# Patient Record
Sex: Male | Born: 1980 | Hispanic: Yes | Marital: Married | Smoking: Never smoker
Health system: Southern US, Community
[De-identification: ages and names within clinical notes are randomized; demographics above are authoritative.]

## PROBLEM LIST (undated history)

## (undated) DIAGNOSIS — E119 Type 2 diabetes mellitus without complications: Secondary | ICD-10-CM

## (undated) HISTORY — DX: Type 2 diabetes mellitus without complications: E11.9

---

## 2018-07-08 ENCOUNTER — Other Ambulatory Visit: Payer: Self-pay

## 2018-07-08 ENCOUNTER — Telehealth: Payer: Self-pay

## 2018-07-08 DIAGNOSIS — Z20822 Contact with and (suspected) exposure to covid-19: Secondary | ICD-10-CM

## 2018-07-08 NOTE — Telephone Encounter (Signed)
Matthew Stevenson with [facility] Dept. Requesting COVID 19 results for symptoms. Scheduled for today.

## 2018-07-12 LAB — NOVEL CORONAVIRUS, NAA: SARS-CoV-2, NAA: NOT DETECTED

## 2020-03-20 ENCOUNTER — Inpatient Hospital Stay: Payer: Self-pay

## 2020-03-20 ENCOUNTER — Emergency Department: Payer: Self-pay

## 2020-03-20 ENCOUNTER — Inpatient Hospital Stay
Admission: EM | Admit: 2020-03-20 | Discharge: 2020-03-24 | DRG: 418 | Disposition: A | Discharge status: Home / Self Care | Payer: Self-pay | Attending: Internal Medicine | Admitting: Internal Medicine

## 2020-03-20 ENCOUNTER — Other Ambulatory Visit: Payer: Self-pay

## 2020-03-20 DIAGNOSIS — E1165 Type 2 diabetes mellitus with hyperglycemia: Secondary | ICD-10-CM | POA: Diagnosis present

## 2020-03-20 DIAGNOSIS — K810 Acute cholecystitis: Secondary | ICD-10-CM | POA: Diagnosis present

## 2020-03-20 DIAGNOSIS — K760 Fatty (change of) liver, not elsewhere classified: Secondary | ICD-10-CM | POA: Diagnosis present

## 2020-03-20 DIAGNOSIS — K8000 Calculus of gallbladder with acute cholecystitis without obstruction: Secondary | ICD-10-CM | POA: Diagnosis present

## 2020-03-20 DIAGNOSIS — Z23 Encounter for immunization: Secondary | ICD-10-CM

## 2020-03-20 DIAGNOSIS — K8021 Calculus of gallbladder without cholecystitis with obstruction: Secondary | ICD-10-CM

## 2020-03-20 DIAGNOSIS — R739 Hyperglycemia, unspecified: Secondary | ICD-10-CM | POA: Diagnosis present

## 2020-03-20 DIAGNOSIS — E669 Obesity, unspecified: Secondary | ICD-10-CM | POA: Diagnosis present

## 2020-03-20 DIAGNOSIS — E871 Hypo-osmolality and hyponatremia: Secondary | ICD-10-CM | POA: Diagnosis present

## 2020-03-20 DIAGNOSIS — Z6831 Body mass index (BMI) 31.0-31.9, adult: Secondary | ICD-10-CM

## 2020-03-20 DIAGNOSIS — R7401 Elevation of levels of liver transaminase levels: Secondary | ICD-10-CM | POA: Diagnosis present

## 2020-03-20 DIAGNOSIS — K851 Biliary acute pancreatitis without necrosis or infection: Secondary | ICD-10-CM

## 2020-03-20 DIAGNOSIS — E781 Pure hyperglyceridemia: Secondary | ICD-10-CM | POA: Diagnosis present

## 2020-03-20 DIAGNOSIS — E119 Type 2 diabetes mellitus without complications: Secondary | ICD-10-CM | POA: Diagnosis present

## 2020-03-20 DIAGNOSIS — K828 Other specified diseases of gallbladder: Secondary | ICD-10-CM | POA: Diagnosis present

## 2020-03-20 DIAGNOSIS — R109 Unspecified abdominal pain: Secondary | ICD-10-CM

## 2020-03-20 DIAGNOSIS — Z20822 Contact with and (suspected) exposure to covid-19: Secondary | ICD-10-CM | POA: Diagnosis present

## 2020-03-20 DIAGNOSIS — R1011 Right upper quadrant pain: Secondary | ICD-10-CM

## 2020-03-20 DIAGNOSIS — E118 Type 2 diabetes mellitus with unspecified complications: Secondary | ICD-10-CM

## 2020-03-20 HISTORY — DX: Biliary acute pancreatitis without necrosis or infection: K85.10

## 2020-03-20 LAB — HIV ANTIBODY (ROUTINE TESTING W REFLEX): HIV Screen 4th Generation wRfx: NONREACTIVE

## 2020-03-20 LAB — COMPREHENSIVE METABOLIC PANEL
ALT: 248 U/L — ABNORMAL HIGH (ref 0–44)
AST: 104 U/L — ABNORMAL HIGH (ref 15–41)
Albumin: 4.3 g/dL (ref 3.5–5.0)
Alkaline Phosphatase: 231 U/L — ABNORMAL HIGH (ref 38–126)
Anion gap: 11 (ref 5–15)
BUN: 18 mg/dL (ref 6–20)
CO2: 21 mmol/L — ABNORMAL LOW (ref 22–32)
Calcium: 9.2 mg/dL (ref 8.9–10.3)
Chloride: 99 mmol/L (ref 98–111)
Creatinine, Ser: 0.79 mg/dL (ref 0.61–1.24)
GFR, Estimated: >60 mL/min (ref >60)
Glucose, Bld: 278 mg/dL — ABNORMAL HIGH (ref 70–99)
Potassium: 3.7 mmol/L (ref 3.5–5.1)
Sodium: 131 mmol/L — ABNORMAL LOW (ref 135–145)
Total Bilirubin: 2.2 mg/dL — ABNORMAL HIGH (ref 0.3–1.2)
Total Protein: 9.1 g/dL — ABNORMAL HIGH (ref 6.5–8.1)

## 2020-03-20 LAB — CBC
HCT: 43.8 % (ref 39.0–52.0)
Hemoglobin: 15.3 g/dL (ref 13.0–17.0)
MCH: 30.4 pg (ref 26.0–34.0)
MCHC: 34.9 g/dL (ref 30.0–36.0)
MCV: 86.9 fL (ref 80.0–100.0)
Platelets: 244 10*3/uL (ref 150–400)
RBC: 5.04 MIL/uL (ref 4.22–5.81)
RDW: 13.7 % (ref 11.5–15.5)
WBC: 8.7 10*3/uL (ref 4.0–10.5)
nRBC: 0 % (ref 0.0–0.2)

## 2020-03-20 LAB — URINALYSIS, COMPLETE (UACMP) WITH MICROSCOPIC
Bacteria, UA: NONE SEEN
Bilirubin Urine: NEGATIVE
Glucose, UA: >=500 mg/dL — AB
Hgb urine dipstick: NEGATIVE
Ketones, ur: 20 mg/dL — AB
Leukocytes,Ua: NEGATIVE
Nitrite: NEGATIVE
Protein, ur: NEGATIVE mg/dL
Specific Gravity, Urine: >1.046 — ABNORMAL HIGH (ref 1.005–1.030)
pH: 6 (ref 5.0–8.0)

## 2020-03-20 LAB — CBG MONITORING, ED: Glucose-Capillary: 170 mg/dL — ABNORMAL HIGH (ref 70–99)

## 2020-03-20 LAB — RESP PANEL BY RT-PCR (FLU A&B, COVID) ARPGX2
Influenza A by PCR: NEGATIVE
Influenza B by PCR: NEGATIVE
SARS Coronavirus 2 by RT PCR: NEGATIVE

## 2020-03-20 LAB — LIPASE, BLOOD: Lipase: 246 U/L — ABNORMAL HIGH (ref 11–51)

## 2020-03-20 LAB — TRIGLYCERIDES: Triglycerides: 299 mg/dL — ABNORMAL HIGH (ref <150)

## 2020-03-20 LAB — GLUCOSE, CAPILLARY: Glucose-Capillary: 164 mg/dL — ABNORMAL HIGH (ref 70–99)

## 2020-03-20 LAB — HEMOGLOBIN A1C
Hgb A1c MFr Bld: 8.5 % — ABNORMAL HIGH (ref 4.8–5.6)
Mean Plasma Glucose: 197.25 mg/dL

## 2020-03-20 MED ORDER — MORPHINE SULFATE (PF) 4 MG/ML IV SOLN
4.0000 mg | INTRAVENOUS | Status: DC | PRN
Start: 2020-03-20 — End: 2020-03-20
  Administered 2020-03-20: 4 mg via INTRAVENOUS
  Filled 2020-03-20: qty 1

## 2020-03-20 MED ORDER — PIPERACILLIN-TAZOBACTAM 3.375 G IVPB
3.3750 g | Freq: Three times a day (TID) | INTRAVENOUS | Status: DC
  Administered 2020-03-20 – 2020-03-24 (×13): 3.375 g via INTRAVENOUS
  Filled 2020-03-20 (×15): qty 50

## 2020-03-20 MED ORDER — ONDANSETRON HCL 4 MG/2ML IJ SOLN: 4.0000 mg | Freq: Four times a day (QID) | INTRAMUSCULAR | Status: DC | PRN

## 2020-03-20 MED ORDER — SODIUM CHLORIDE 0.9 % IV SOLN: Freq: Once | INTRAVENOUS | Status: AC

## 2020-03-20 MED ORDER — ENOXAPARIN SODIUM 40 MG/0.4ML ~~LOC~~ SOLN
40.0000 mg | SUBCUTANEOUS | Status: DC
  Administered 2020-03-20 – 2020-03-23 (×4): 40 mg via SUBCUTANEOUS
  Filled 2020-03-20 (×4): qty 0.4

## 2020-03-20 MED ORDER — ONDANSETRON HCL 4 MG PO TABS
4.0000 mg | ORAL_TABLET | Freq: Four times a day (QID) | ORAL | Status: DC | PRN
  Administered 2020-03-21: 18:00:00 4 mg via ORAL
  Filled 2020-03-20: qty 1

## 2020-03-20 MED ORDER — GADOBUTROL 1 MMOL/ML IV SOLN
8.0000 mL | Freq: Once | INTRAVENOUS | Status: AC | PRN
  Administered 2020-03-20: 8 mL via INTRAVENOUS

## 2020-03-20 MED ORDER — INFLUENZA VAC SPLIT QUAD 0.5 ML IM SUSY
0.5000 mL | PREFILLED_SYRINGE | INTRAMUSCULAR | Status: AC
  Administered 2020-03-21: 0.5 mL via INTRAMUSCULAR
  Filled 2020-03-20: qty 0.5

## 2020-03-20 MED ORDER — SODIUM CHLORIDE 0.9 % IV SOLN: INTRAVENOUS | Status: DC

## 2020-03-20 MED ORDER — MORPHINE SULFATE (PF) 2 MG/ML IV SOLN: 2.0000 mg | INTRAVENOUS | Status: DC | PRN

## 2020-03-20 MED ORDER — IOHEXOL 300 MG/ML  SOLN
100.0000 mL | Freq: Once | INTRAMUSCULAR | Status: AC | PRN
  Administered 2020-03-20: 100 mL via INTRAVENOUS
  Filled 2020-03-20: qty 100

## 2020-03-20 MED ORDER — SODIUM CHLORIDE 0.9 % IV BOLUS
1000.0000 mL | Freq: Once | INTRAVENOUS | Status: AC
  Administered 2020-03-20: 1000 mL via INTRAVENOUS

## 2020-03-20 MED ORDER — PANTOPRAZOLE SODIUM 40 MG IV SOLR
40.0000 mg | Freq: Every day | INTRAVENOUS | Status: DC
  Administered 2020-03-20 – 2020-03-24 (×5): 40 mg via INTRAVENOUS
  Filled 2020-03-20 (×5): qty 40

## 2020-03-20 MED ORDER — ONDANSETRON HCL 4 MG/2ML IJ SOLN
4.0000 mg | Freq: Once | INTRAMUSCULAR | Status: AC
  Administered 2020-03-20: 4 mg via INTRAVENOUS
  Filled 2020-03-20: qty 2

## 2020-03-20 MED ORDER — HYDROMORPHONE HCL 1 MG/ML IJ SOLN
0.5000 mg | INTRAMUSCULAR | Status: DC | PRN
  Administered 2020-03-20 – 2020-03-22 (×5): 0.5 mg via INTRAVENOUS
  Filled 2020-03-20 (×5): qty 1

## 2020-03-20 NOTE — ED Provider Notes (Signed)
[facility] Emergency Department Provider Note    Event Date/Time   First MD Initiated Contact with Patient 03/20/20 1059     (approximate)  I have reviewed the triage vital signs and the nursing notes.   HISTORY  Chief Complaint Abdominal Pain    HPI Matthew Stevenson is a 40 y.o. male no significant past medical history presenting with several days of midepigastric pain radiating through to his back associated nausea and couple episodes of vomiting.  Does not drink alcohol.  No previous history of any medical conditions but is not seen a doctor in many years.  Denies any fevers.  No chest pain or shortness of breath.    History reviewed. No pertinent past medical history. Family History  Family history unknown: Yes   History reviewed. No pertinent surgical history. Patient Active Problem List   Diagnosis Date Noted  . Acute gallstone pancreatitis 03/20/2020  . Hyperglycemia 03/20/2020  . Acute cholecystitis 03/20/2020  . Transaminitis 03/20/2020  . Hyponatremia 03/20/2020      Prior to Admission medications   Not on File    Allergies Patient has no allergy information on record.    Social History Social History   Tobacco Use  . Smoking status: Never Smoker  . Smokeless tobacco: Never Used  Substance Use Topics  . Alcohol use: Yes    Comment: soc  . Drug use: Never    Review of Systems Patient denies headaches, rhinorrhea, blurry vision, numbness, shortness of breath, chest pain, edema, cough, abdominal pain, nausea, vomiting, diarrhea, dysuria, fevers, rashes or hallucinations unless otherwise stated above in HPI. ____________________________________________   PHYSICAL EXAM:  VITAL SIGNS: Vitals:   03/20/20 1017 03/20/20 1331  BP: (!) 137/98 135/83  Pulse: 73 60  Resp: 16 18  Temp: 98.4 F (36.9 C)   SpO2: 99% 100%    Constitutional: Alert and oriented.  Eyes: Conjunctivae are normal.  Head: Atraumatic. Nose:  No congestion/rhinnorhea. Mouth/Throat: Mucous membranes are moist.   Neck: No stridor. Painless ROM.  Cardiovascular: Normal rate, regular rhythm. Grossly normal heart sounds.  Good peripheral circulation. Respiratory: Normal respiratory effort.  No retractions. Lungs CTAB. Gastrointestinal: Soft with mild ttp in epigastric region  No distention. No abdominal bruits. No CVA tenderness. Genitourinary:  Musculoskeletal: No lower extremity tenderness nor edema.  No joint effusions. Neurologic:  Normal speech and language. No gross focal neurologic deficits are appreciated. No facial droop Skin:  Skin is warm, dry and intact. No rash noted. Psychiatric: Mood and affect are normal. Speech and behavior are normal.  ____________________________________________   LABS (all labs ordered are listed, but only abnormal results are displayed)  Results for orders placed or performed during the hospital encounter of 03/20/20 (from the past 24 hour(s))  Lipase, blood     Status: Abnormal   Collection Time: 03/20/20 10:19 AM  Result Value Ref Range   Lipase 246 (H) 11 - 51 U/L  Comprehensive metabolic panel     Status: Abnormal   Collection Time: 03/20/20 10:19 AM  Result Value Ref Range   Sodium 131 (L) 135 - 145 mmol/L   Potassium 3.7 3.5 - 5.1 mmol/L   Chloride 99 98 - 111 mmol/L   CO2 21 (L) 22 - 32 mmol/L   Glucose, Bld 278 (H) 70 - 99 mg/dL   BUN 18 6 - 20 mg/dL   Creatinine, Ser 1.61 0.61 - 1.24 mg/dL   Calcium 9.2 8.9 - 09.6 mg/dL   Total Protein 9.1 (  H) 6.5 - 8.1 g/dL   Albumin 4.3 3.5 - 5.0 g/dL   AST 270 (H) 15 - 41 U/L   ALT 248 (H) 0 - 44 U/L   Alkaline Phosphatase 231 (H) 38 - 126 U/L   Total Bilirubin 2.2 (H) 0.3 - 1.2 mg/dL   GFR, Estimated >35 >00 mL/min   Anion gap 11 5 - 15  CBC     Status: None   Collection Time: 03/20/20 10:19 AM  Result Value Ref Range   WBC 8.7 4.0 - 10.5 K/uL   RBC 5.04 4.22 - 5.81 MIL/uL   Hemoglobin 15.3 13.0 - 17.0 g/dL   HCT 93.8 18.2 - 99.3  %   MCV 86.9 80.0 - 100.0 fL   MCH 30.4 26.0 - 34.0 pg   MCHC 34.9 30.0 - 36.0 g/dL   RDW 71.6 96.7 - 89.3 %   Platelets 244 150 - 400 K/uL   nRBC 0.0 0.0 - 0.2 %  Triglycerides     Status: Abnormal   Collection Time: 03/20/20 10:19 AM  Result Value Ref Range   Triglycerides 299 (H) <150 mg/dL  Resp Panel by RT-PCR (Flu A&B, Covid) Nasopharyngeal Swab     Status: None   Collection Time: 03/20/20  1:26 PM   Specimen: Nasopharyngeal Swab; Nasopharyngeal(NP) swabs in vial transport medium  Result Value Ref Range   SARS Coronavirus 2 by RT PCR NEGATIVE NEGATIVE   Influenza A by PCR NEGATIVE NEGATIVE   Influenza B by PCR NEGATIVE NEGATIVE  Urinalysis, Complete w Microscopic Urine, Clean Catch     Status: Abnormal   Collection Time: 03/20/20  1:52 PM  Result Value Ref Range   Color, Urine YELLOW (A) YELLOW   APPearance CLEAR (A) CLEAR   Specific Gravity, Urine >1.046 (H) 1.005 - 1.030   pH 6.0 5.0 - 8.0   Glucose, UA >=500 (A) NEGATIVE mg/dL   Hgb urine dipstick NEGATIVE NEGATIVE   Bilirubin Urine NEGATIVE NEGATIVE   Ketones, ur 20 (A) NEGATIVE mg/dL   Protein, ur NEGATIVE NEGATIVE mg/dL   Nitrite NEGATIVE NEGATIVE   Leukocytes,Ua NEGATIVE NEGATIVE   RBC / HPF 0-5 0 - 5 RBC/hpf   WBC, UA 0-5 0 - 5 WBC/hpf   Bacteria, UA NONE SEEN NONE SEEN   Squamous Epithelial / LPF 0-5 0 - 5   ____________________________________________  EKG My review and personal interpretation at Time: 10:12   Indication: epigastric pain  Rate: 70  Rhythm: sinus Axis: normal Other: normal intervals, no stemi ____________________________________________  RADIOLOGY  I personally reviewed all radiographic images ordered to evaluate for the above acute complaints and reviewed radiology reports and findings.  These findings were personally discussed with the patient.  Please see medical record for radiology report.  ____________________________________________   PROCEDURES  Procedure(s) performed:   Procedures    Critical Care performed: no ____________________________________________   INITIAL IMPRESSION / ASSESSMENT AND PLAN / ED COURSE  Pertinent labs & imaging results that were available during my care of the patient were reviewed by me and considered in my medical decision making (see chart for details).   DDX: Pancreatitis, cholelithiasis, cholecystitis, enteritis, dehydration, electrolyte abnormality  Matthew Stevenson is a 40 y.o. who presents to the ED with presentation as described above.  Blood work ordered at triage shows findings concerning for pancreatitis probable gallstone pancreatitis given mildly elevated LFTs mildly elevated bilirubin.  CT imaging ordered for the above differential does not show any clear etiology.  Right upper quadrant ultrasound  was ordered which shows evidence of cholelithiasis.  Call common bile duct within normal range.  Trace pericholecystic fluid.  Given his pain and symptoms will consult hospitalist.  I did discuss case in consultation with surgery as well.  Discussed case with Dr. Norma Fredrickson GI recommends MRCP to evaluate for to choledocholithiasis though given normal common bile duct as well as reassuring CT likely stone is already passed.  Results and findings discussed with patient.  Clinical Course as of 03/20/20 1545  Wed Mar 20, 2020  1401 Patient's triglycerides are elevated.  On my review of the ultrasound it does show that he is got gallstones.  Do believe he is going require hospitalization for further evaluation. [PR]    Clinical Course User Index [PR] Willy Eddy, MD    The patient was evaluated in Emergency Department today for the symptoms described in the history of present illness. He/she was evaluated in the context of the global COVID-19 pandemic, which necessitated consideration that the patient might be at risk for infection with the SARS-CoV-2 virus that causes COVID-19. Institutional protocols and algorithms that  pertain to the evaluation of patients at risk for COVID-19 are in a state of rapid change based on information released by regulatory bodies including the CDC and federal and state organizations. These policies and algorithms were followed during the patient's care in the ED.  As part of my medical decision making, I reviewed the following data within the electronic MEDICAL RECORD NUMBER Nursing notes reviewed and incorporated, Labs reviewed, notes from prior ED visits and [place] Controlled Substance Database   ____________________________________________   FINAL CLINICAL IMPRESSION(S) / ED DIAGNOSES  Final diagnoses:  RUQ pain  Acute gallstone pancreatitis      NEW MEDICATIONS STARTED DURING THIS VISIT:  New Prescriptions   No medications on file     Note:  This document was prepared using Dragon voice recognition software and may include unintentional dictation errors.    Willy Eddy, MD 03/20/20 [phone]

## 2020-03-20 NOTE — Consult Note (Signed)
[facility] SURGICAL CONSULTATION NOTE (initial) - cpt: 79892   HISTORY OF PRESENT ILLNESS (HPI):  40 y.o. male presented to [facility] ED today for evaluation of abdominal pain . Patient reports he has had intermittent epigastric and RUQ abdominal pain over the course of the last 5 days. At the worst, this was an 8/10 and is worse with eating. Had cheeseburger around 4AM after work and noticed exacerbation of pain around 6AM. He does endorse associated nausea and non-bloody emesis with this. No fever, chills, cough, chest pain, or SOB. He did have an episode of pain last Friday as well and did notice his urine looked "like sweet tea." No previous intra-abdominal surgeries. Work up in the ED revealed normal WBC at 8.7K, normal sCr - 0.79, mild hyperbilirubinemia to 2.2, and elevated lipase to 246. RUQ [place] was concerning for cholelithiasis and possible cholecystitis  Surgery is consulted by emergency medicine physician Dr. Willy Eddy, MD in this context for evaluation and management of cholelithiasis.  PAST MEDICAL HISTORY (PMH):  History reviewed. No pertinent past medical history.   PAST SURGICAL HISTORY (PSH):  History reviewed. No pertinent surgical history.   MEDICATIONS:  Prior to Admission medications   Not on File     ALLERGIES:  Not on File   SOCIAL HISTORY:  Social History   Socioeconomic History  . Marital status: Married    Spouse name: Not on file  . Number of children: Not on file  . Years of education: Not on file  . Highest education level: Not on file  Occupational History  . Not on file  Tobacco Use  . Smoking status: Never Smoker  . Smokeless tobacco: Never Used  Substance and Sexual Activity  . Alcohol use: Yes    Comment: soc  . Drug use: Never  . Sexual activity: Not on file  Other Topics Concern  . Not on file  Social History Narrative  . Not on file   Social Determinants of Health   Financial Resource Strain: Not on file  Food  Insecurity: Not on file  Transportation Needs: Not on file  Physical Activity: Not on file  Stress: Not on file  Social Connections: Not on file  Intimate Partner Violence: Not on file     FAMILY HISTORY:  Family History  Family history unknown: Yes      REVIEW OF SYSTEMS:  Review of Systems  Constitutional: Negative for chills and fever.  HENT: Negative for congestion and sore throat.   Respiratory: Negative for cough and shortness of breath.   Cardiovascular: Negative for chest pain and palpitations.  Gastrointestinal: Positive for abdominal pain, nausea and vomiting. Negative for blood in stool, constipation, diarrhea and melena.  Genitourinary: Negative for dysuria and urgency.  All other systems reviewed and are negative.   VITAL SIGNS:  Temp:  [98.4 F (36.9 C)] 98.4 F (36.9 C) (03/02 1017) Pulse Rate:  [60-73] 60 (03/02 1331) Resp:  [16-18] 18 (03/02 1331) BP: (135-137)/(83-98) 135/83 (03/02 1331) SpO2:  [99 %-100 %] 100 % (03/02 1331) Weight:  [86.2 kg] 86.2 kg (03/02 1018)     Height: 5\' 5"  (165.1 cm) Weight: 86.2 kg BMI (Calculated): 31.62   INTAKE/OUTPUT:  No intake/output data recorded.  PHYSICAL EXAM:  Physical Exam Vitals and nursing note reviewed. Exam conducted with a chaperone present.  Constitutional:      General: He is not in acute distress.    Appearance: He is well-developed and normal weight. He is not ill-appearing.  Eyes:     General: No scleral icterus.    Extraocular Movements: Extraocular movements intact.  Cardiovascular:     Rate and Rhythm: Normal rate and regular rhythm.     Heart sounds: Normal heart sounds. No murmur heard.   Pulmonary:     Effort: Pulmonary effort is normal. No respiratory distress.     Breath sounds: Normal breath sounds.  Abdominal:     General: There is no distension.     Palpations: Abdomen is soft.     Tenderness: There is abdominal tenderness in the right upper quadrant and epigastric area. There is  no guarding or rebound. Positive signs include Murphy's sign.  Genitourinary:    Comments: Deferred Skin:    General: Skin is warm.     Coloration: Skin is not jaundiced or pale.  Neurological:     General: No focal deficit present.     Mental Status: He is alert and oriented to person, place, and time.  Psychiatric:        Mood and Affect: Mood normal.        Behavior: Behavior normal.      Labs:  CBC Latest Ref Rng & Units 03/20/2020  WBC 4.0 - 10.5 K/uL 8.7  Hemoglobin 13.0 - 17.0 g/dL 62.8  Hematocrit 31.5 - 52.0 % 43.8  Platelets 150 - 400 K/uL 244   CMP Latest Ref Rng & Units 03/20/2020  Glucose 70 - 99 mg/dL 176(H)  BUN 6 - 20 mg/dL 18  Creatinine 6.07 - 3.71 mg/dL 0.62  Sodium 694 - 854 mmol/L 131(L)  Potassium 3.5 - 5.1 mmol/L 3.7  Chloride 98 - 111 mmol/L 99  CO2 22 - 32 mmol/L 21(L)  Calcium 8.9 - 10.3 mg/dL 9.2  Total Protein 6.5 - 8.1 g/dL 9.1(H)  Total Bilirubin 0.3 - 1.2 mg/dL 2.2(H)  Alkaline Phos 38 - 126 U/L 231(H)  AST 15 - 41 U/L 104(H)  ALT 0 - 44 U/L 248(H)    Imaging studies:   CT Abdomen/Pelvis (03/20/2020) personally reviewed without any obvious intra-abdominal findings, and radiologist report reviewed:  IMPRESSION: 1. No acute findings in the abdomen or pelvis. Specifically, no findings to explain the patient's history of abdominal pain and vomiting. 2. Geographic fatty deposition in the liver.   RUQ [place] (03/20/2020) personally reviewed showing significant cholelithiasis and possible peri-cholecystitic fluid, and radiologist report reviewed: IMPRESSION: Cholelithiasis with numerous gallstones present. There is suggestion of edema in the gallbladder fossa between the gallbladder and liver consistent with acute inflammation. There is no overt gallbladder wall thickening.   Assessment/Plan: (ICD-10's: K29.20) 40 y.o. male with RUQ abdominal pain found to have cholelithiasis and elevated bilirubin and lipase levels concerning for potential  choledocholithiasis   - Appreciate medicine admission  - Agree with plan for MRCP to rule out choledocholithiasis   - Will tentatively plan for laparoscopic cholecystectomy on Friday with Dr Claudine Mouton pending OR/Anesthesia availability   - NPO for now + IVF resuscitation  - IV ABx (Zosyn)  - Monitor abdominal examination; on-going bowel function  - Pain control prn; antiemetics prn  - monitor labs: CMP, Lipase   - Mobilization as tolerated   - Further management per primary service    All of the above findings and recommendations were discussed with the patient, and all of patient's questions were answered to his expressed satisfaction.  Thank you for the opportunity to participate in this patient's care.   -- Lynden Oxford, PA-C [facility] 03/20/2020, 3:13 PM [phone] M-F:  7am - 4pm

## 2020-03-20 NOTE — Consult Note (Signed)
Pharmacy Antibiotic Note  Manpreet Strey is a 40 y.o. male admitted on 03/20/2020 with concern for  Intra-abdominal infection (cholecystitis).  Pharmacy has been consulted for Zosyn dosing.  Plan: Zosyn 3.375g IV q8h (4 hour infusion).  Height: 5\' 5"  (165.1 cm) Weight: 86.2 kg (190 lb) IBW/kg (Calculated) : 61.5  Temp (24hrs), Avg:98.4 F (36.9 C), Min:98.4 F (36.9 C), Max:98.4 F (36.9 C)  Recent Labs  Lab 03/20/20 1019  WBC 8.7  CREATININE 0.79    Estimated Creatinine Clearance: 124 mL/min (by C-G formula based on SCr of 0.79 mg/dL).    Not on File  Antimicrobials this admission: Zosyn  (3/2 >>    Dose adjustments this admission: N/A  Microbiology results: N/A  Thank you for allowing pharmacy to be a part of this patient's care.  [id number] 03/20/2020 3:33 PM

## 2020-03-20 NOTE — H&P (Addendum)
History and Physical    Matthew Stevenson ZHG:[id number] DOB: 10/20/1980 DOA: 03/20/2020  PCP: Patient, No Pcp Per   Patient coming from Home  I have personally briefly reviewed patient's old medical records in [facility]  Chief Complaint: Abdominal pain  HPI: Matthew Stevenson is a 40 y.o. male with no significant past medical history who presents to the emergency room for evaluation of abdominal pain which he has had intermittently for the last 5 days.  Pain is mostly in the epigastrium with radiation to the right upper quadrant and is rated an 8 x 10 in intensity at its worst.  It is associated with meals.  He has also had nausea and vomiting with this abdominal pain. He denies having any fever or chills, no dizziness, no lightheadedness, no palpitations, no chest pain, no shortness of breath, no urinary frequency, no nocturia, no dysuria, no hematuria, no cough, no headache, no blurred vision. He denies alcohol use and is not on any medications. Labs show sodium 131, potassium 3.7, chloride 99, bicarb 21, glucose 278, BUN 18, creatinine 0.79, calcium 9.2, alkaline phosphatase 231, albumin 4.3, lipase 246, AST 104, ALT 248,, triglycerides 299, white count 8.7, hemoglobin 15.3, hematocrit 43.8, MCV 86.9, RDW 13.7, platelet count 244 Respiratory viral panel negative CT scan of abdomen and pelvis shows no acute findings in the abdomen or pelvis. Specifically, no findings to explain the patient's history of abdominal pain and Vomiting. Geographic fatty deposition in the liver. Gallbladder ultrasound shows cholelithiasis with numerous gallstones present. There is suggestion of edema in the gallbladder fossa between the gallbladder and liver consistent with acute inflammation. There is no overt gallbladder wall thickening. Twelve-lead EKG reviewed by me shows normal sinus rhythm   ED Course: Patient is a 40 year old Hispanic male who presents to the ER for evaluation of abdominal pain  mostly in the epigastrium with radiation to the right upper quadrant.  He has an elevated lipase level as well his transaminitis and hyperglycemia.  Gallbladder ultrasound is suggestive of an acute inflammation.  Patient will be admitted to the hospital for acute gallstone pancreatitis.   Review of Systems: As per HPI otherwise all other systems reviewed and negative.    History reviewed. No pertinent past medical history.  History reviewed. No pertinent surgical history.   reports that he has never smoked. He has never used smokeless tobacco. He reports current alcohol use. He reports that he does not use drugs.  Not on File  Family History  Family history unknown: Yes      Prior to Admission medications   Not on File    Physical Exam: Vitals:   03/20/20 1017 03/20/20 1018 03/20/20 1331  BP: (!) 137/98  135/83  Pulse: 73  60  Resp: 16  18  Temp: 98.4 F (36.9 C)    TempSrc: Oral    SpO2: 99%  100%  Weight:  86.2 kg   Height:  5\' 5"  (1.651 m)      Vitals:   03/20/20 1017 03/20/20 1018 03/20/20 1331  BP: (!) 137/98  135/83  Pulse: 73  60  Resp: 16  18  Temp: 98.4 F (36.9 C)    TempSrc: Oral    SpO2: 99%  100%  Weight:  86.2 kg   Height:  5\' 5"  (1.651 m)       Constitutional: Alert and oriented x 3 . Not in any apparent distress.  Appears uncomfortable HEENT:      Head: Normocephalic and  atraumatic.         Eyes: PERLA, EOMI, Conjunctivae are normal. Sclera is non-icteric.       Mouth/Throat: Mucous membranes are moist.       Neck: Supple with no signs of meningismus. Cardiovascular: Regular rate and rhythm. No murmurs, gallops, or rubs. 2+ symmetrical distal pulses are present . No JVD. No LE edema Respiratory: Respiratory effort normal .Lungs sounds clear bilaterally. No wheezes, crackles, or rhonchi.  Gastrointestinal: Soft,  Tender in the epigastrium and right upper quadrant, and non distended with positive bowel sounds.  Genitourinary: No CVA  tenderness. Musculoskeletal: Nontender with normal range of motion in all extremities. No cyanosis, or erythema of extremities. Neurologic:  Face is symmetric. Moving all extremities. No gross focal neurologic deficits  Skin: Skin is warm, dry.  No rash or ulcers Psychiatric: Mood and affect are normal   Labs on Admission: I have personally reviewed following labs and imaging studies  CBC: Recent Labs  Lab 03/20/20 1019  WBC 8.7  HGB 15.3  HCT 43.8  MCV 86.9  PLT 244   Basic Metabolic Panel: Recent Labs  Lab 03/20/20 1019  NA 131*  K 3.7  CL 99  CO2 21*  GLUCOSE 278*  BUN 18  CREATININE 0.79  CALCIUM 9.2   GFR: Estimated Creatinine Clearance: 124 mL/min (by C-G formula based on SCr of 0.79 mg/dL). Liver Function Tests: Recent Labs  Lab 03/20/20 1019  AST 104*  ALT 248*  ALKPHOS 231*  BILITOT 2.2*  PROT 9.1*  ALBUMIN 4.3   Recent Labs  Lab 03/20/20 1019  LIPASE 246*   No results for input(s): AMMONIA in the last 168 hours. Coagulation Profile: No results for input(s): INR, PROTIME in the last 168 hours. Cardiac Enzymes: No results for input(s): CKTOTAL, CKMB, CKMBINDEX, TROPONINI in the last 168 hours. BNP (last 3 results) No results for input(s): PROBNP in the last 8760 hours. HbA1C: No results for input(s): HGBA1C in the last 72 hours. CBG: No results for input(s): GLUCAP in the last 168 hours. Lipid Profile: Recent Labs    03/20/20 1019  TRIG 299*   Thyroid Function Tests: No results for input(s): TSH, T4TOTAL, FREET4, T3FREE, THYROIDAB in the last 72 hours. Anemia Panel: No results for input(s): VITAMINB12, FOLATE, FERRITIN, TIBC, IRON, RETICCTPCT in the last 72 hours. Urine analysis:    Component Value Date/Time   COLORURINE YELLOW (A) 03/20/2020 1352   APPEARANCEUR CLEAR (A) 03/20/2020 1352   LABSPEC >1.046 (H) 03/20/2020 1352   PHURINE 6.0 03/20/2020 1352   GLUCOSEU >=500 (A) 03/20/2020 1352   HGBUR NEGATIVE 03/20/2020 1352    BILIRUBINUR NEGATIVE 03/20/2020 1352   KETONESUR 20 (A) 03/20/2020 1352   PROTEINUR NEGATIVE 03/20/2020 1352   NITRITE NEGATIVE 03/20/2020 1352   LEUKOCYTESUR NEGATIVE 03/20/2020 1352    Radiological Exams on Admission: CT ABDOMEN PELVIS W CONTRAST  Result Date: 03/20/2020 CLINICAL DATA:  Abdominal pain.  Vomiting. EXAM: CT ABDOMEN AND PELVIS WITH CONTRAST TECHNIQUE: Multidetector CT imaging of the abdomen and pelvis was performed using the standard protocol following bolus administration of intravenous contrast. CONTRAST:  OMNIPAQUE IOHEXOL 300 MG/ML  SOLN COMPARISON:  None. FINDINGS: Lower chest: Unremarkable. Hepatobiliary: No suspicious focal abnormality within the liver parenchyma. Geographic fatty deposition noted. Gallbladder is distended but otherwise unremarkable. No intrahepatic or extrahepatic biliary dilation. Pancreas: No focal mass lesion. No dilatation of the main duct. No intraparenchymal cyst. No peripancreatic edema. Spleen: No splenomegaly. No focal mass lesion. Adrenals/Urinary Tract: No adrenal nodule  or mass. Kidneys unremarkable. No evidence for hydroureter. The urinary bladder appears normal for the degree of distention. Stomach/Bowel: Stomach is nondistended. Duodenum is normally positioned as is the ligament of Treitz. No small bowel wall thickening. No small bowel dilatation. The terminal ileum is normal. The appendix is normal. No gross colonic mass. No colonic wall thickening. Vascular/Lymphatic: No abdominal aortic aneurysm. No abdominal aortic atherosclerotic calcification. Portal vein, superior mesenteric vein, and splenic vein are patent. No retroperitoneal or mesenteric lymphadenopathy. Upper normal lymph nodes in the gastrohepatic and hepatoduodenal ligaments are most likely reactive. No pelvic sidewall lymphadenopathy. Reproductive: The prostate gland and seminal vesicles are unremarkable. Other: No intraperitoneal free fluid. Musculoskeletal: No worrisome lytic or  sclerotic osseous abnormality. IMPRESSION: 1. No acute findings in the abdomen or pelvis. Specifically, no findings to explain the patient's history of abdominal pain and vomiting. 2. Geographic fatty deposition in the liver. Electronically Signed   By: Kennith CenterEric  Mansell M.D.   On: 03/20/2020 12:59   US ABDOMEN LIMITED RUQ (LIVER/GB)  Result Date: 03/20/2020 CLINICAL DATA:  Right upper quadrant abdominal pain and vomiting. EXAM: ULTRASOUND ABDOMEN LIMITED RIGHT UPPER QUADRANT COMPARISON:  CT of the abdomen and pelvis earlier today. FINDINGS: Gallbladder: Numerous echogenic calculi are seen within the gallbladder lumen. The gallbladder is mildly distended. There is some edema/fluid in the gallbladder fossa adjacent to the liver. No significant gallbladder wall thickening. Common bile duct: Diameter: 6 mm Liver: The liver demonstrates coarse echotexture and increased echogenicity, likely reflecting diffuse steatosis. No overt cirrhotic contour abnormalities or focal lesions are identified. There is no evidence of intrahepatic biliary ductal dilatation. The portal vein is patent on color Doppler imaging with normal direction of blood flow towards the liver. Other: None. IMPRESSION: Cholelithiasis with numerous gallstones present. There is suggestion of edema in the gallbladder fossa between the gallbladder and liver consistent with acute inflammation. There is no overt gallbladder wall thickening. Electronically Signed   By: Irish LackGlenn  Yamagata M.D.   On: 03/20/2020 14:46     Assessment/Plan Principal Problem:   Acute gallstone pancreatitis Active Problems:   Hyperglycemia   Acute cholecystitis   Transaminitis   Hyponatremia     Acute gallstone pancreatitis We will keep patient n.p.o. Supportive care with pain control, IV fluid hydration, antiemetics and IV PPI Gallbladder ultrasound does not show any evidence of a CBD stone and CBD diameter is about 6 mm We will obtain MRCP and we will request GI consult  if abnormal We will request surgery consult   Acute cholecystitis We will start patient empirically on antibiotic therapy with Zosyn We will request surgical evaluation    Hyperglycemia Patient does not have a known history of diabetes mellitus but had a random blood of sugar of 278 g/dl We will check blood sugars every 4 hours Obtain hemoglobin A1c level Patient noted to have elevated triglycerides as well as hepatic steatosis on ultrasound    Hyponatremia Most likely secondary to poor oral intake Continue IV fluid hydration with saline   DVT prophylaxis: Lovenox Code Status: full code Family Communication: Greater than 50% of time was spent discussing plan of care with patient at the bedside.  All questions and concerns have been addressed.  He verbalizes understanding and agrees with the plan. Disposition Plan: Back to previous home environment Consults called: Surgery Status: Inpatient.  The medical decision making for this patient was of high complexity and patient is at high risk for clinical deterioration during this hospitalization.    Lucile Shuttersochukwu Oseph Imburgia MD  [facility]     03/20/2020, 3:05 PM

## 2020-03-20 NOTE — ED Triage Notes (Signed)
Pt come via POV from home with c/o abdominal pain. Pt states this started last Friday. Pt states some vomiting. Pt states 10/10 pain

## 2020-03-21 DIAGNOSIS — K810 Acute cholecystitis: Secondary | ICD-10-CM

## 2020-03-21 DIAGNOSIS — R739 Hyperglycemia, unspecified: Secondary | ICD-10-CM

## 2020-03-21 DIAGNOSIS — K851 Biliary acute pancreatitis without necrosis or infection: Principal | ICD-10-CM

## 2020-03-21 DIAGNOSIS — E118 Type 2 diabetes mellitus with unspecified complications: Secondary | ICD-10-CM

## 2020-03-21 DIAGNOSIS — E871 Hypo-osmolality and hyponatremia: Secondary | ICD-10-CM

## 2020-03-21 DIAGNOSIS — E119 Type 2 diabetes mellitus without complications: Secondary | ICD-10-CM | POA: Diagnosis present

## 2020-03-21 LAB — GLUCOSE, CAPILLARY
Glucose-Capillary: 142 mg/dL — ABNORMAL HIGH (ref 70–99)
Glucose-Capillary: 160 mg/dL — ABNORMAL HIGH (ref 70–99)
Glucose-Capillary: 166 mg/dL — ABNORMAL HIGH (ref 70–99)
Glucose-Capillary: 179 mg/dL — ABNORMAL HIGH (ref 70–99)
Glucose-Capillary: 181 mg/dL — ABNORMAL HIGH (ref 70–99)
Glucose-Capillary: 250 mg/dL — ABNORMAL HIGH (ref 70–99)

## 2020-03-21 LAB — COMPREHENSIVE METABOLIC PANEL
ALT: 200 U/L — ABNORMAL HIGH (ref 0–44)
AST: 82 U/L — ABNORMAL HIGH (ref 15–41)
Albumin: 3.6 g/dL (ref 3.5–5.0)
Alkaline Phosphatase: 210 U/L — ABNORMAL HIGH (ref 38–126)
Anion gap: 10 (ref 5–15)
BUN: 15 mg/dL (ref 6–20)
CO2: 21 mmol/L — ABNORMAL LOW (ref 22–32)
Calcium: 8.5 mg/dL — ABNORMAL LOW (ref 8.9–10.3)
Chloride: 103 mmol/L (ref 98–111)
Creatinine, Ser: 0.64 mg/dL (ref 0.61–1.24)
GFR, Estimated: >60 mL/min (ref >60)
Glucose, Bld: 163 mg/dL — ABNORMAL HIGH (ref 70–99)
Potassium: 3.7 mmol/L (ref 3.5–5.1)
Sodium: 134 mmol/L — ABNORMAL LOW (ref 135–145)
Total Bilirubin: 2.5 mg/dL — ABNORMAL HIGH (ref 0.3–1.2)
Total Protein: 8.1 g/dL (ref 6.5–8.1)

## 2020-03-21 LAB — HEPATITIS PANEL, ACUTE
HCV Ab: NONREACTIVE
Hep A IgM: NONREACTIVE
Hep B C IgM: NONREACTIVE
Hepatitis B Surface Ag: NONREACTIVE

## 2020-03-21 LAB — CBC
HCT: 42.5 % (ref 39.0–52.0)
Hemoglobin: 14.1 g/dL (ref 13.0–17.0)
MCH: 29.7 pg (ref 26.0–34.0)
MCHC: 33.2 g/dL (ref 30.0–36.0)
MCV: 89.5 fL (ref 80.0–100.0)
Platelets: 260 10*3/uL (ref 150–400)
RBC: 4.75 MIL/uL (ref 4.22–5.81)
RDW: 13.8 % (ref 11.5–15.5)
WBC: 14.4 10*3/uL — ABNORMAL HIGH (ref 4.0–10.5)
nRBC: 0 % (ref 0.0–0.2)

## 2020-03-21 LAB — LIPASE, BLOOD: Lipase: 112 U/L — ABNORMAL HIGH (ref 11–51)

## 2020-03-21 MED ORDER — ACETAMINOPHEN 325 MG PO TABS
650.0000 mg | ORAL_TABLET | Freq: Four times a day (QID) | ORAL | Status: DC | PRN
  Administered 2020-03-21: 09:00:00 650 mg via ORAL
  Filled 2020-03-21: qty 2

## 2020-03-21 MED ORDER — INSULIN ASPART 100 UNIT/ML ~~LOC~~ SOLN
5.0000 [IU] | Freq: Once | SUBCUTANEOUS | Status: AC
  Administered 2020-03-21: 17:00:00 5 [IU] via SUBCUTANEOUS
  Filled 2020-03-21: qty 1

## 2020-03-21 NOTE — Progress Notes (Signed)
[facility] SURGICAL PROGRESS NOTE (cpt [phone])  Hospital Day(s): 1.   Interval History: Patient seen and examined, no acute events or new complaints overnight. Patient reports he is feeling much better this morning. He denied any fever, chills, cough, nausea, emesis, or abdominal pain. He did have an increase in leukocytosis this morning to 14.4K. Maintaining renal function, sCr - 0.64. Lipase improved some to 112. He continues to have hyperbilirubinemia to 2.5 but MRCP yesterday is without choledocholithiasis. He is currently NPO. Continues on Zosyn. Tentative plan for cholecystectomy tomorrow.   Review of Systems:  Constitutional: denies fever, chills  HEENT: denies cough or congestion  Respiratory: denies any shortness of breath  Cardiovascular: denies chest pain or palpitations  Gastrointestinal: denies abdominal pain, N/V, or diarrhea/and bowel function as per interval history Genitourinary: denies burning with urination or urinary frequency   Vital signs in last 24 hours: [min-max] current  Temp:  [97.9 F (36.6 C)-99.2 F (37.3 C)] 99.2 F (37.3 C) (03/03 0425) Pulse Rate:  [60-98] 98 (03/03 0425) Resp:  [16-24] 24 (03/03 0425) BP: (130-138)/(77-98) 135/84 (03/03 0425) SpO2:  [95 %-100 %] 96 % (03/03 0425) Weight:  [86.2 kg] 86.2 kg (03/02 1018)     Height: 5\' 5"  (165.1 cm) Weight: 86.2 kg BMI (Calculated): 31.62   Intake/Output last 2 shifts:  03/02 0701 - 03/03 0700 In: -  Out: 600 [Urine:600]   Physical Exam:  Constitutional: alert, cooperative and no distress  HENT: normocephalic without obvious abnormality  Eyes: PERRL, EOM's grossly intact and symmetric  Respiratory: breathing non-labored at rest  Cardiovascular: regular rate and sinus rhythm  Gastrointestinal: soft, non-tender, and non-distended, no rebound/guarding Musculoskeletal: no edema or wounds, motor and sensation grossly intact, NT    Labs:  CBC Latest Ref Rng & Units 03/21/2020  03/20/2020  WBC 4.0 - 10.5 K/uL 14.4(H) 8.7  Hemoglobin 13.0 - 17.0 g/dL 05/20/2020 49.8  Hematocrit 26.4 - 52.0 % 42.5 43.8  Platelets 150 - 400 K/uL 260 244   CMP Latest Ref Rng & Units 03/21/2020 03/20/2020  Glucose 70 - 99 mg/dL 05/20/2020) 309(M)  BUN 6 - 20 mg/dL 15 18  Creatinine 076(K - 1.24 mg/dL 0.88 1.10  Sodium 3.15 - 145 mmol/L 134(L) 131(L)  Potassium 3.5 - 5.1 mmol/L 3.7 3.7  Chloride 98 - 111 mmol/L 103 99  CO2 22 - 32 mmol/L 21(L) 21(L)  Calcium 8.9 - 10.3 mg/dL 945) 9.2  Total Protein 6.5 - 8.1 g/dL 8.1 8.5(F)  Total Bilirubin 0.3 - 1.2 mg/dL 2.5(H) 2.2(H)  Alkaline Phos 38 - 126 U/L 210(H) 231(H)  AST 15 - 41 U/L 82(H) 104(H)  ALT 0 - 44 U/L 200(H) 248(H)     Imaging studies:   MRCP (03/20/2020) personally reviewed showing cholelithiasis, no choledocholithiasis, and radiologist report reviewed:  IMPRESSION: 1. Cholelithiasis without cholecystitis. 2. Unremarkable MRCP with no biliary dilation or Choledocholithiasis.   Assessment/Plan: (ICD-10's: K81.0) 40 y.o. male with now resolution in abdominal pain admitted with cholecystitis   - CLD today; NPO at midnight  - Plan for laparoscopic cholecystectomy on Friday with Dr Monday pending OR/Anesthesia availability    - IV ABx (Zosyn)             - Monitor abdominal examination; on-going bowel function             - Pain control prn; antiemetics prn             - monitor labs: CMP, Lipase              -  Mobilization as tolerated              - Further management per primary service     All of the above findings and recommendations were discussed with the patient, and the medical team, and all of patient's questions were answered to his expressed satisfaction.   -- Lynden Oxford, PA-C [facility] 03/21/2020, 7:16 AM [phone] M-F: 7am - 4pm

## 2020-03-21 NOTE — Anesthesia Preprocedure Evaluation (Signed)
Anesthesia Evaluation  Patient identified by MRN, date of birth, ID band Patient awake    Reviewed: Allergy & Precautions, NPO status , Patient's Chart, lab work & pertinent test results  History of Anesthesia Complications Negative for: history of anesthetic complications  Airway Mallampati: III       Dental   Pulmonary neg sleep apnea, neg COPD, Not current smoker,           Cardiovascular (-) hypertension(-) Past MI and (-) CHF (-) dysrhythmias (-) Valvular Problems/Murmurs     Neuro/Psych neg Seizures    GI/Hepatic Neg liver ROS, neg GERD  ,  Endo/Other  diabetes (untreated)  Renal/GU negative Renal ROS     Musculoskeletal   Abdominal   Peds  Hematology   Anesthesia Other Findings History reviewed. No pertinent past medical history.   Reproductive/Obstetrics                            Anesthesia Physical Anesthesia Plan  ASA: II  Anesthesia Plan: General   Post-op Pain Management:    Induction: Intravenous  PONV Risk Score and Plan: Ondansetron and Dexamethasone  Airway Management Planned: Oral ETT  Additional Equipment:   Intra-op Plan:   Post-operative Plan:   Informed Consent: I have reviewed the patients History and Physical, chart, labs and discussed the procedure including the risks, benefits and alternatives for the proposed anesthesia with the patient or authorized representative who has indicated his/her understanding and acceptance.       Plan Discussed with:   Anesthesia Plan Comments:         Anesthesia Quick Evaluation

## 2020-03-21 NOTE — Progress Notes (Addendum)
Progress Note    Matthew Stevenson  ZOX:[id number] DOB: Oct 31, 1980  DOA: 03/20/2020 PCP: Patient, No Pcp Per      Brief Narrative:    Medical records reviewed and are as summarized below:  Matthew Stevenson is a 40 y.o. male       Assessment/Plan:   Principal Problem:   Acute gallstone pancreatitis Active Problems:   Hyperglycemia   Acute cholecystitis   Transaminitis   Hyponatremia   Type 2 diabetes mellitus with complication, without long-term current use of insulin (HCC)   Body mass index is 31.62 kg/m.  (Obesity)   Fever, acute gallstone pancreatitis, acute cholecystitis: Continue analgesics and IV antibiotics.  He is on clear liquid diet. Discontinue IV fluids for now.  N.p.o. after midnight.  Plan for laparoscopic cholecystectomy tomorrow.  Follow-up with general surgeon.  Elevated liver enzymes: This is likely from gallstone pancreatitis. Continue to monitor levels.  Type II DM with hyperglycemia: Hemoglobin A1c is 8.5. Check lipid panel. Metformin will be prescribed at discharge.  Monitor glucose closely.  No insulin for now since he is on clear liquid diet and will be n.p.o. after midnight.  Hyponatremia: Improving.  Diet Order            Diet NPO time specified  Diet effective midnight           Diet clear liquid Room service appropriate? Yes; Fluid consistency: Thin  Diet effective now                    Consultants:  General surgeon  Procedures:  Plan for cholecystectomy tomorrow    Medications:   . enoxaparin (LOVENOX) injection  40 mg Subcutaneous Q24H  . pantoprazole (PROTONIX) IV  40 mg Intravenous Daily   Continuous Infusions: . piperacillin-tazobactam (ZOSYN)  IV 3.375 g (03/21/20 1407)     Anti-infectives (From admission, onward)   Start     Dose/Rate Route Frequency Ordered Stop   03/20/20 1530  piperacillin-tazobactam (ZOSYN) IVPB 3.375 g        3.375 g 12.5 mL/hr over 240 Minutes Intravenous Every 8 hours  03/20/20 1527               Family Communication/Anticipated D/C date and plan/Code Status   DVT prophylaxis: enoxaparin (LOVENOX) injection 40 mg Start: 03/20/20 2200     Code Status: Full Code  Family Communication: None Disposition Plan:    Status is: Inpatient  Remains inpatient appropriate because:IV treatments appropriate due to intensity of illness or inability to take PO   Dispo: The patient is from: Home              Anticipated d/c is to: Home              Patient currently is not medically stable to d/c.   Difficult to place patient No           Subjective:   C/o abdominal pain but is better compared to yesterday.  He had fever with T-max of 101.3 this morning.  No nausea vomiting.  Objective:    Vitals:   03/21/20 0005 03/21/20 0425 03/21/20 0817 03/21/20 1216  BP: 138/87 135/84 133/78 134/77  Pulse: 96 98 91 90  Resp: 16 (!) 24 20 16   Temp: 97.9 F (36.6 C) 99.2 F (37.3 C) (!) 101.3 F (38.5 C) 98.2 F (36.8 C)  TempSrc:  Oral  Oral  SpO2: 98% 96% 97% 97%  Weight:  Height:       No data found.   Intake/Output Summary (Last 24 hours) at 03/21/2020 1443 Last data filed at 03/21/2020 0440 Gross per 24 hour  Intake --  Output 600 ml  Net -600 ml   Filed Weights   03/20/20 1018  Weight: 86.2 kg    Exam:  GEN: NAD SKIN: No rash EYES: EOMI ENT: MMM CV: RRR PULM: CTA B ABD: soft, ND, NT, +BS CNS: AAO x 3, non focal EXT: No edema or tenderness       Data Reviewed:   I have personally reviewed following labs and imaging studies:  Labs: Labs show the following:   Basic Metabolic Panel: Recent Labs  Lab 03/20/20 1019 03/21/20 0450  NA 131* 134*  K 3.7 3.7  CL 99 103  CO2 21* 21*  GLUCOSE 278* 163*  BUN 18 15  CREATININE 0.79 0.64  CALCIUM 9.2 8.5*   GFR Estimated Creatinine Clearance: 124 mL/min (by C-G formula based on SCr of 0.64 mg/dL). Liver Function Tests: Recent Labs  Lab 03/20/20 1019  03/21/20 0450  AST 104* 82*  ALT 248* 200*  ALKPHOS 231* 210*  BILITOT 2.2* 2.5*  PROT 9.1* 8.1  ALBUMIN 4.3 3.6   Recent Labs  Lab 03/20/20 1019 03/21/20 0450  LIPASE 246* 112*   No results for input(s): AMMONIA in the last 168 hours. Coagulation profile No results for input(s): INR, PROTIME in the last 168 hours.  CBC: Recent Labs  Lab 03/20/20 1019 03/21/20 0450  WBC 8.7 14.4*  HGB 15.3 14.1  HCT 43.8 42.5  MCV 86.9 89.5  PLT 244 260   Cardiac Enzymes: No results for input(s): CKTOTAL, CKMB, CKMBINDEX, TROPONINI in the last 168 hours. BNP (last 3 results) No results for input(s): PROBNP in the last 8760 hours. CBG: Recent Labs  Lab 03/20/20 2014 03/21/20 0004 03/21/20 0538 03/21/20 0823 03/21/20 1210  GLUCAP 164* 142* 179* 166* 160*   D-Dimer: No results for input(s): DDIMER in the last 72 hours. Hgb A1c: Recent Labs    03/20/20 1607  HGBA1C 8.5*   Lipid Profile: Recent Labs    03/20/20 1019  TRIG 299*   Thyroid function studies: No results for input(s): TSH, T4TOTAL, T3FREE, THYROIDAB in the last 72 hours.  Invalid input(s): FREET3 Anemia work up: No results for input(s): VITAMINB12, FOLATE, FERRITIN, TIBC, IRON, RETICCTPCT in the last 72 hours. Sepsis Labs: Recent Labs  Lab 03/20/20 1019 03/21/20 0450  WBC 8.7 14.4*    Microbiology Recent Results (from the past 240 hour(s))  Resp Panel by RT-PCR (Flu A&B, Covid) Nasopharyngeal Swab     Status: None   Collection Time: 03/20/20  1:26 PM   Specimen: Nasopharyngeal Swab; Nasopharyngeal(NP) swabs in vial transport medium  Result Value Ref Range Status   SARS Coronavirus 2 by RT PCR NEGATIVE NEGATIVE Final    Comment: (NOTE) SARS-CoV-2 target nucleic acids are NOT DETECTED.  The SARS-CoV-2 RNA is generally detectable in upper respiratory specimens during the acute phase of infection. The lowest concentration of SARS-CoV-2 viral copies this assay can detect is 138 copies/mL. A  negative result does not preclude SARS-Cov-2 infection and should not be used as the sole basis for treatment or other patient management decisions. A negative result may occur with  improper specimen collection/handling, submission of specimen other than nasopharyngeal swab, presence of viral mutation(s) within the areas targeted by this assay, and inadequate number of viral copies(<138 copies/mL). A negative result must be combined with clinical  observations, patient history, and epidemiological information. The expected result is Negative.  Fact Sheet for Patients:  BloggerCourse.com  Fact Sheet for Healthcare Providers:  SeriousBroker.it  This test is no t yet approved or cleared by the [place] FDA and  has been authorized for detection and/or diagnosis of SARS-CoV-2 by FDA under an Emergency Use Authorization (EUA). This EUA will remain  in effect (meaning this test can be used) for the duration of the COVID-19 declaration under Section 564(b)(1) of the Act, 21 U.S.C.section 360bbb-3(b)(1), unless the authorization is terminated  or revoked sooner.       Influenza A by PCR NEGATIVE NEGATIVE Final   Influenza B by PCR NEGATIVE NEGATIVE Final    Comment: (NOTE) The Xpert Xpress SARS-CoV-2/FLU/RSV plus assay is intended as an aid in the diagnosis of influenza from Nasopharyngeal swab specimens and should not be used as a sole basis for treatment. Nasal washings and aspirates are unacceptable for Xpert Xpress SARS-CoV-2/FLU/RSV testing.  Fact Sheet for Patients: BloggerCourse.com  Fact Sheet for Healthcare Providers: SeriousBroker.it  This test is not yet approved or cleared by the [place] FDA and has been authorized for detection and/or diagnosis of SARS-CoV-2 by FDA under an Emergency Use Authorization (EUA). This EUA will remain in effect (meaning this test can  be used) for the duration of the COVID-19 declaration under Section 564(b)(1) of the Act, 21 U.S.C. section 360bbb-3(b)(1), unless the authorization is terminated or revoked.  Performed at [facility], [address], [place], [place] [address]     Procedures and diagnostic studies:  CT ABDOMEN PELVIS W CONTRAST  Result Date: 03/20/2020 CLINICAL DATA:  Abdominal pain.  Vomiting. EXAM: CT ABDOMEN AND PELVIS WITH CONTRAST TECHNIQUE: Multidetector CT imaging of the abdomen and pelvis was performed using the standard protocol following bolus administration of intravenous contrast. CONTRAST:  OMNIPAQUE IOHEXOL 300 MG/ML  SOLN COMPARISON:  None. FINDINGS: Lower chest: Unremarkable. Hepatobiliary: No suspicious focal abnormality within the liver parenchyma. Geographic fatty deposition noted. Gallbladder is distended but otherwise unremarkable. No intrahepatic or extrahepatic biliary dilation. Pancreas: No focal mass lesion. No dilatation of the main duct. No intraparenchymal cyst. No peripancreatic edema. Spleen: No splenomegaly. No focal mass lesion. Adrenals/Urinary Tract: No adrenal nodule or mass. Kidneys unremarkable. No evidence for hydroureter. The urinary bladder appears normal for the degree of distention. Stomach/Bowel: Stomach is nondistended. Duodenum is normally positioned as is the ligament of Treitz. No small bowel wall thickening. No small bowel dilatation. The terminal ileum is normal. The appendix is normal. No gross colonic mass. No colonic wall thickening. Vascular/Lymphatic: No abdominal aortic aneurysm. No abdominal aortic atherosclerotic calcification. Portal vein, superior mesenteric vein, and splenic vein are patent. No retroperitoneal or mesenteric lymphadenopathy. Upper normal lymph nodes in the gastrohepatic and hepatoduodenal ligaments are most likely reactive. No pelvic sidewall lymphadenopathy. Reproductive: The prostate gland and seminal vesicles are  unremarkable. Other: No intraperitoneal free fluid. Musculoskeletal: No worrisome lytic or sclerotic osseous abnormality. IMPRESSION: 1. No acute findings in the abdomen or pelvis. Specifically, no findings to explain the patient's history of abdominal pain and vomiting. 2. Geographic fatty deposition in the liver. Electronically Signed   By: Kennith Center M.D.   On: 03/20/2020 12:59   MR 3D Recon At Scanner  Result Date: 03/21/2020 : Please see Dr. Elly Modena report under accession [id number] [facility] Electronically Signed   By: Gaylyn Rong M.D.   On: 03/21/2020 08:30   MR ABDOMEN MRCP W WO CONTAST  Result Date: 03/20/2020  CLINICAL DATA:  Cholelithiasis, pericholecystic edema on ultrasound EXAM: MRI ABDOMEN WITHOUT AND WITH CONTRAST (INCLUDING MRCP) TECHNIQUE: Multiplanar multisequence MR imaging of the abdomen was performed both before and after the administration of intravenous contrast. Heavily T2-weighted images of the biliary and pancreatic ducts were obtained, and three-dimensional MRCP images were rendered by post processing. CONTRAST:  92mL GADAVIST GADOBUTROL 1 MMOL/ML IV SOLN COMPARISON:  03/20/2020 FINDINGS: Lower chest: No acute pleural or parenchymal lung disease. Hepatobiliary: Multiple gallstones are seen filling the gallbladder lumen, largest measuring 18 mm. No gallbladder wall thickening or pericholecystic fluid. The cystic duct appears patent. Common bile duct measures up to 5 mm in diameter, with no evidence of choledocholithiasis. No intrahepatic duct dilation. The liver is unremarkable with no focal abnormality. Pancreas: No mass, inflammatory changes, or other parenchymal abnormality identified. Spleen:  Within normal limits in size and appearance. Adrenals/Urinary Tract: Renal parenchyma enhances normally. No focal abnormalities. No evidence of obstructive uropathy. Bilateral adrenal glands are normal. Stomach/Bowel: No evidence of bowel obstruction or ileus. No bowel wall  thickening. Vascular/Lymphatic: Normal caliber of the abdominal aorta. No pathologic adenopathy. Other:  No free fluid or free gas.  No abdominal wall hernia. Musculoskeletal: No acute or destructive bony lesions. IMPRESSION: 1. Cholelithiasis without cholecystitis. 2. Unremarkable MRCP with no biliary dilation or choledocholithiasis. Electronically Signed   By: Sharlet Salina M.D.   On: 03/20/2020 20:13   US ABDOMEN LIMITED RUQ (LIVER/GB)  Result Date: 03/20/2020 CLINICAL DATA:  Right upper quadrant abdominal pain and vomiting. EXAM: ULTRASOUND ABDOMEN LIMITED RIGHT UPPER QUADRANT COMPARISON:  CT of the abdomen and pelvis earlier today. FINDINGS: Gallbladder: Numerous echogenic calculi are seen within the gallbladder lumen. The gallbladder is mildly distended. There is some edema/fluid in the gallbladder fossa adjacent to the liver. No significant gallbladder wall thickening. Common bile duct: Diameter: 6 mm Liver: The liver demonstrates coarse echotexture and increased echogenicity, likely reflecting diffuse steatosis. No overt cirrhotic contour abnormalities or focal lesions are identified. There is no evidence of intrahepatic biliary ductal dilatation. The portal vein is patent on color Doppler imaging with normal direction of blood flow towards the liver. Other: None. IMPRESSION: Cholelithiasis with numerous gallstones present. There is suggestion of edema in the gallbladder fossa between the gallbladder and liver consistent with acute inflammation. There is no overt gallbladder wall thickening. Electronically Signed   By: Irish Lack M.D.   On: 03/20/2020 14:46               LOS: 1 day   Karleigh Bunte  [facility]   Pager on [email or web address]. If 7PM-7AM, please contact night-coverage at [email or web address]     03/21/2020, 2:43 PM

## 2020-03-22 ENCOUNTER — Inpatient Hospital Stay: Payer: Self-pay | Admitting: Anesthesiology

## 2020-03-22 ENCOUNTER — Encounter: Payer: Self-pay | Admitting: Internal Medicine

## 2020-03-22 ENCOUNTER — Encounter
Admission: EM | Disposition: A | Discharge status: Home / Self Care | Payer: Self-pay | Source: Home / Self Care | Attending: Internal Medicine

## 2020-03-22 DIAGNOSIS — K8000 Calculus of gallbladder with acute cholecystitis without obstruction: Secondary | ICD-10-CM

## 2020-03-22 HISTORY — PX: CHOLECYSTECTOMY: SHX55

## 2020-03-22 LAB — CBC
HCT: 38.7 % — ABNORMAL LOW (ref 39.0–52.0)
Hemoglobin: 13.5 g/dL (ref 13.0–17.0)
MCH: 30.2 pg (ref 26.0–34.0)
MCHC: 34.9 g/dL (ref 30.0–36.0)
MCV: 86.6 fL (ref 80.0–100.0)
Platelets: 269 10*3/uL (ref 150–400)
RBC: 4.47 MIL/uL (ref 4.22–5.81)
RDW: 13 % (ref 11.5–15.5)
WBC: 15.5 10*3/uL — ABNORMAL HIGH (ref 4.0–10.5)
nRBC: 0 % (ref 0.0–0.2)

## 2020-03-22 LAB — COMPREHENSIVE METABOLIC PANEL
ALT: 143 U/L — ABNORMAL HIGH (ref 0–44)
AST: 50 U/L — ABNORMAL HIGH (ref 15–41)
Albumin: 3.1 g/dL — ABNORMAL LOW (ref 3.5–5.0)
Alkaline Phosphatase: 197 U/L — ABNORMAL HIGH (ref 38–126)
Anion gap: 8 (ref 5–15)
BUN: 13 mg/dL (ref 6–20)
CO2: 24 mmol/L (ref 22–32)
Calcium: 8.3 mg/dL — ABNORMAL LOW (ref 8.9–10.3)
Chloride: 99 mmol/L (ref 98–111)
Creatinine, Ser: 0.7 mg/dL (ref 0.61–1.24)
GFR, Estimated: >60 mL/min (ref >60)
Glucose, Bld: 168 mg/dL — ABNORMAL HIGH (ref 70–99)
Potassium: 3.8 mmol/L (ref 3.5–5.1)
Sodium: 131 mmol/L — ABNORMAL LOW (ref 135–145)
Total Bilirubin: 2.7 mg/dL — ABNORMAL HIGH (ref 0.3–1.2)
Total Protein: 7.4 g/dL (ref 6.5–8.1)

## 2020-03-22 LAB — GLUCOSE, CAPILLARY
Glucose-Capillary: 144 mg/dL — ABNORMAL HIGH (ref 70–99)
Glucose-Capillary: 151 mg/dL — ABNORMAL HIGH (ref 70–99)
Glucose-Capillary: 157 mg/dL — ABNORMAL HIGH (ref 70–99)
Glucose-Capillary: 157 mg/dL — ABNORMAL HIGH (ref 70–99)
Glucose-Capillary: 167 mg/dL — ABNORMAL HIGH (ref 70–99)
Glucose-Capillary: 168 mg/dL — ABNORMAL HIGH (ref 70–99)
Glucose-Capillary: 206 mg/dL — ABNORMAL HIGH (ref 70–99)

## 2020-03-22 LAB — LIPID PANEL
Cholesterol: 168 mg/dL (ref 0–200)
HDL: 25 mg/dL — ABNORMAL LOW (ref >40)
LDL Cholesterol: 114 mg/dL — ABNORMAL HIGH (ref 0–99)
Total CHOL/HDL Ratio: 6.7 RATIO
Triglycerides: 143 mg/dL (ref <150)
VLDL: 29 mg/dL (ref 0–40)

## 2020-03-22 SURGERY — CHOLECYSTECTOMY, ROBOT-ASSISTED, LAPAROSCOPIC
Anesthesia: General

## 2020-03-22 MED ORDER — DEXAMETHASONE SODIUM PHOSPHATE 10 MG/ML IJ SOLN
INTRAMUSCULAR | Status: DC | PRN
  Administered 2020-03-22: 5 mg via INTRAVENOUS

## 2020-03-22 MED ORDER — BUPIVACAINE-EPINEPHRINE (PF) 0.25% -1:200000 IJ SOLN
INTRAMUSCULAR | Status: DC | PRN
  Administered 2020-03-22: 30 mL

## 2020-03-22 MED ORDER — LIDOCAINE HCL (CARDIAC) PF 100 MG/5ML IV SOSY
PREFILLED_SYRINGE | INTRAVENOUS | Status: DC | PRN
  Administered 2020-03-22: 100 mg via INTRAVENOUS

## 2020-03-22 MED ORDER — ACETAMINOPHEN 10 MG/ML IV SOLN
INTRAVENOUS | Status: DC | PRN
  Administered 2020-03-22: 1000 mg via INTRAVENOUS

## 2020-03-22 MED ORDER — MIDAZOLAM HCL 2 MG/2ML IJ SOLN
INTRAMUSCULAR | Status: DC | PRN
  Administered 2020-03-22 (×2): 1 mg via INTRAVENOUS

## 2020-03-22 MED ORDER — LIDOCAINE HCL (PF) 2 % IJ SOLN
INTRAMUSCULAR | Status: AC
  Filled 2020-03-22: qty 5

## 2020-03-22 MED ORDER — ROCURONIUM BROMIDE 10 MG/ML (PF) SYRINGE
PREFILLED_SYRINGE | INTRAVENOUS | Status: AC
  Filled 2020-03-22: qty 10

## 2020-03-22 MED ORDER — ACETAMINOPHEN 10 MG/ML IV SOLN
INTRAVENOUS | Status: AC
  Filled 2020-03-22: qty 100

## 2020-03-22 MED ORDER — MIDAZOLAM HCL 2 MG/2ML IJ SOLN
INTRAMUSCULAR | Status: AC
  Filled 2020-03-22: qty 2

## 2020-03-22 MED ORDER — SUGAMMADEX SODIUM 200 MG/2ML IV SOLN
INTRAVENOUS | Status: DC | PRN
  Administered 2020-03-22: 200 mg via INTRAVENOUS

## 2020-03-22 MED ORDER — PROPOFOL 10 MG/ML IV BOLUS
INTRAVENOUS | Status: AC
  Filled 2020-03-22: qty 20

## 2020-03-22 MED ORDER — SODIUM CHLORIDE 0.9 % IV SOLN: INTRAVENOUS | Status: DC | PRN

## 2020-03-22 MED ORDER — ROCURONIUM BROMIDE 100 MG/10ML IV SOLN
INTRAVENOUS | Status: DC | PRN
  Administered 2020-03-22: 40 mg via INTRAVENOUS
  Administered 2020-03-22 (×2): 50 mg via INTRAVENOUS

## 2020-03-22 MED ORDER — ONDANSETRON HCL 4 MG/2ML IJ SOLN
INTRAMUSCULAR | Status: DC | PRN
  Administered 2020-03-22: 4 mg via INTRAVENOUS

## 2020-03-22 MED ORDER — LACTATED RINGERS IV SOLN: INTRAVENOUS | Status: DC

## 2020-03-22 MED ORDER — FENTANYL CITRATE (PF) 100 MCG/2ML IJ SOLN
INTRAMUSCULAR | Status: AC
  Filled 2020-03-22: qty 2

## 2020-03-22 MED ORDER — DEXAMETHASONE SODIUM PHOSPHATE 10 MG/ML IJ SOLN
INTRAMUSCULAR | Status: AC
  Filled 2020-03-22: qty 1

## 2020-03-22 MED ORDER — ONDANSETRON HCL 4 MG/2ML IJ SOLN
INTRAMUSCULAR | Status: AC
  Filled 2020-03-22: qty 2

## 2020-03-22 MED ORDER — PROPOFOL 10 MG/ML IV BOLUS
INTRAVENOUS | Status: DC | PRN
  Administered 2020-03-22: 180 mg via INTRAVENOUS

## 2020-03-22 MED ORDER — INDOCYANINE GREEN 25 MG IV SOLR
2.5000 mg | Freq: Once | INTRAVENOUS | Status: AC
  Administered 2020-03-22: 2.5 mg via INTRAVENOUS
  Filled 2020-03-22: qty 1

## 2020-03-22 MED ORDER — FENTANYL CITRATE (PF) 100 MCG/2ML IJ SOLN
25.0000 ug | INTRAMUSCULAR | Status: DC | PRN
Start: 2020-03-22 — End: 2020-03-22

## 2020-03-22 MED ORDER — LIVING WELL WITH DIABETES BOOK - IN SPANISH
Freq: Once | Status: AC
  Filled 2020-03-22 (×2): qty 1

## 2020-03-22 MED ORDER — FENTANYL CITRATE (PF) 100 MCG/2ML IJ SOLN
INTRAMUSCULAR | Status: DC | PRN
  Administered 2020-03-22: 25 ug via INTRAVENOUS
  Administered 2020-03-22: 50 ug via INTRAVENOUS
  Administered 2020-03-22: 25 ug via INTRAVENOUS
  Administered 2020-03-22 (×2): 50 ug via INTRAVENOUS

## 2020-03-22 MED ORDER — HYDROCODONE-ACETAMINOPHEN 5-325 MG PO TABS
1.0000 | ORAL_TABLET | ORAL | Status: DC | PRN
  Administered 2020-03-24: 02:00:00 1 via ORAL
  Filled 2020-03-22: qty 1

## 2020-03-22 MED ORDER — KETOROLAC TROMETHAMINE 30 MG/ML IJ SOLN
30.0000 mg | Freq: Four times a day (QID) | INTRAMUSCULAR | Status: DC
  Administered 2020-03-22 – 2020-03-24 (×9): 30 mg via INTRAVENOUS
  Filled 2020-03-22 (×9): qty 1

## 2020-03-22 MED ORDER — ONDANSETRON HCL 4 MG/2ML IJ SOLN: 4.0000 mg | Freq: Once | INTRAMUSCULAR | Status: DC | PRN

## 2020-03-22 MED ORDER — BUPIVACAINE LIPOSOME 1.3 % IJ SUSP
INTRAMUSCULAR | Status: DC | PRN
  Administered 2020-03-22: 20 mL

## 2020-03-22 SURGICAL SUPPLY — 47 items
BULB RESERV EVAC DRAIN JP 100C (MISCELLANEOUS) ×2 IMPLANT
CHLORAPREP W/TINT 26 (MISCELLANEOUS) ×2 IMPLANT
CLIP VESOLOCK MED LG 6/CT (CLIP) IMPLANT
COVER TIP SHEARS 8 DVNC (MISCELLANEOUS) ×1 IMPLANT
COVER TIP SHEARS 8MM DA VINCI (MISCELLANEOUS) ×1
COVER WAND RF STERILE (DRAPES) ×2 IMPLANT
DECANTER SPIKE VIAL GLASS SM (MISCELLANEOUS) IMPLANT
DEFOGGER SCOPE WARMER CLEARIFY (MISCELLANEOUS) ×2 IMPLANT
DERMABOND ADVANCED (GAUZE/BANDAGES/DRESSINGS) ×1
DERMABOND ADVANCED .7 DNX12 (GAUZE/BANDAGES/DRESSINGS) ×1 IMPLANT
DRAIN CHANNEL JP 19F (MISCELLANEOUS) ×2 IMPLANT
DRAPE ARM DVNC X/XI (DISPOSABLE) ×4 IMPLANT
DRAPE COLUMN DVNC XI (DISPOSABLE) ×1 IMPLANT
DRAPE DA VINCI XI ARM (DISPOSABLE) ×4
DRAPE DA VINCI XI COLUMN (DISPOSABLE) ×1
ELECT CAUTERY BLADE 6.4 (BLADE) ×2 IMPLANT
GAUZE SPONGE 4X4 12PLY STRL (GAUZE/BANDAGES/DRESSINGS) ×2 IMPLANT
GLOVE ORTHO TXT STRL SZ7.5 (GLOVE) ×10 IMPLANT
GOWN STRL REUS W/ TWL LRG LVL3 (GOWN DISPOSABLE) ×4 IMPLANT
GOWN STRL REUS W/TWL LRG LVL3 (GOWN DISPOSABLE) ×4
GRASPER SUT TROCAR 14GX15 (MISCELLANEOUS) ×2 IMPLANT
INFUSOR MANOMETER BAG 3000ML (MISCELLANEOUS) IMPLANT
IRRIGATOR SUCT 8 DISP DVNC XI (IRRIGATION / IRRIGATOR) ×1 IMPLANT
IRRIGATOR SUCTION 8MM XI DISP (IRRIGATION / IRRIGATOR) ×1
IV NS IRRIG 3000ML ARTHROMATIC (IV SOLUTION) ×2 IMPLANT
KIT PINK PAD W/HEAD ARE REST (MISCELLANEOUS) ×2
KIT PINK PAD W/HEAD ARM REST (MISCELLANEOUS) ×1 IMPLANT
KIT TURNOVER KIT A (KITS) ×2 IMPLANT
LABEL OR SOLS (LABEL) ×2 IMPLANT
MANIFOLD NEPTUNE II (INSTRUMENTS) ×2 IMPLANT
NEEDLE HYPO 22GX1.5 SAFETY (NEEDLE) ×2 IMPLANT
NEEDLE INSUFFLATION 14GA 120MM (NEEDLE) ×2 IMPLANT
NS IRRIG 500ML POUR BTL (IV SOLUTION) ×2 IMPLANT
PACK LAP CHOLECYSTECTOMY (MISCELLANEOUS) ×2 IMPLANT
PENCIL ELECTRO HAND CTR (MISCELLANEOUS) ×2 IMPLANT
POUCH SPECIMEN RETRIEVAL 10MM (ENDOMECHANICALS) ×2 IMPLANT
SEAL CANN UNIV 5-8 DVNC XI (MISCELLANEOUS) ×4 IMPLANT
SEAL XI 5MM-8MM UNIVERSAL (MISCELLANEOUS) ×4
SET TUBE SMOKE EVAC HIGH FLOW (TUBING) ×2 IMPLANT
SOLUTION ELECTROLUBE (MISCELLANEOUS) ×2 IMPLANT
SUT ETHILON 3-0 FS-10 30 BLK (SUTURE) ×2
SUT MNCRL 4-0 (SUTURE) ×1
SUT MNCRL 4-0 27XMFL (SUTURE) ×1
SUT VICRYL 0 AB UR-6 (SUTURE) ×2 IMPLANT
SUTURE EHLN 3-0 FS-10 30 BLK (SUTURE) ×1 IMPLANT
SUTURE MNCRL 4-0 27XMF (SUTURE) ×1 IMPLANT
TROCAR Z-THREAD FIOS 11X100 BL (TROCAR) ×2 IMPLANT

## 2020-03-22 NOTE — Op Note (Signed)
Robotic cholecystectomy  Pre-operative Diagnosis: Acute calculus cholecystitis  Post-operative Diagnosis:  Same.  Procedure: Robotic assisted laparoscopic cholecystectomy.  Surgeon: Campbell Lerner, M.D., FACS  Anesthesia: General. with endotracheal tube  Findings: Distended tense edematous gallbladder wall, extensive early adhesions of gallbladder infundibulum and neck.   Estimated Blood Loss: 50 mL         Drains: None         Specimens: Gallbladder           Complications: none  Procedure Details  The patient was seen again in the Holding Room.  1.25-2.5 mg dose of ICG was administered intravenously.  The benefits, complications, treatment options, risks and expected outcomes were discussed with the patient. The likelihood of improving the patient's symptoms with return to their baseline status is good.  The patient and/or family concurred with the proposed plan, giving informed consent, again alternatives reviewed.  The patient was taken to Operating Room, identified, and the procedure verified as robotic assisted laparoscopic cholecystectomy.  Prior to the induction of general anesthesia, antibiotic prophylaxis was administered. VTE prophylaxis was in place. General endotracheal anesthesia was then administered and tolerated well. The patient was positioned in the supine position.  After the induction, the abdomen was prepped with Chloraprep and draped in the sterile fashion.  A Time Out was held and the above information confirmed.   Right infra-umbilical local infiltration with mixture of Exparel with quarter percent Marcaine with epinephrine is utilized.  Made a 12 mm incision on the left infra umbilical site, I advanced an optical 44mm port under direct visualization into the peritoneal cavity.  Once the peritoneum was penetrated, insufflation was transferred.  The trocar was then advanced into the abdominal cavity under direct visualization. Pneumoperitoneum was then continued  with CO2 at 14 mmHg or less and tolerated well without any adverse changes in the patient's vital signs.  Two 8.5-mm ports were placed in the left lower quadrant and laterally, and one to the right lower quadrant, all under direct vision. All skin incisions  were infiltrated with a local anesthetic agent before making the incision and placing the trocars.  The patient was positioned  in reverse Trendelenburg, tilted the patient's left side down.  Da Vinci XI robot was then positioned on to the patient's left side, and docked.  The gallbladder was identified, the fundus was difficult to grasp, and the adjacent omental tissues were mobilized from the fundus and the gallbladder bladder body.  Return to the operating bedside, to place a Veress needle to decompress the gallbladder, contents were aspirated.  I then grasped the fundus via the arm 4 Prograsp and retracted cephalad. Adhesions were lysed with scissors and cautery, and the suction irrigator device due to the extensive edema and inflammatory changes.  The infundibulum was mobilized, identified, grasped and retracted laterally, exposing the peritoneum overlying the triangle of Calot. This was then opened and dissected using blunt dissection with the suction irrigator and bipolar to maintain hemostasis. An extended critical view of the cystic duct was obtained.   ICG via FireFly was noted contrasting of the liver, I never identified it in the common ductal system, so therefore I ensured clip placement to be as close to the gallbladder neck as feasible.   The cystic duct was clearly identified and dissected to isolation.  The cystic duct was double clipped and divided with scissors, leaving the two on the remaining cystic duct stump.  The cystic artery is sealed with bipolar and divided.   The  gallbladder was taken from the gallbladder fossa in a retrograde fashion with blunt dissection using the suction irrigator tip, as there was an excellent well-defined  dissection plane due to the extensive edema, scissors with electrocautery used for hemostasis.  The back wall the gallbladder was opened inadvertently and 2 large stones spilled out along with many smaller stones and for the most part these were all retrieved and controlled.  The gallbladder and its contents was removed and placed in an Endocatch bag.  The liver bed is inspected. Hemostasis was confirmed.  The robot was undocked and moved away from the operative field. Irrigation was utilized and was aspirated clear.  The gallbladder and Endocatch sac were then removed through the infraumbilical port site.  I placed a drain via the right lower quadrant port site and placed it looped in the gallbladder fossa. Inspection of the right upper quadrant was performed. No bleeding, bile duct injury or leak, or bowel injury was noted. The infra-umbilical port site fascia was closed with interrumpted 0 Vicryl sutures using PMI/cone under direct visualization. Pneumoperitoneum was released and ports removed.  4-0 subcuticular Monocryl was used to close the skin. Dermabond was  applied.  The patient was then extubated and brought to the recovery room in stable condition. Sponge, lap, and needle counts were correct at closure and at the conclusion of the case.               Campbell Lerner, M.D., [facility] 03/22/2020 12:23 PM

## 2020-03-22 NOTE — Transfer of Care (Signed)
Immediate Anesthesia Transfer of Care Note  Patient: Matthew Stevenson  Procedure(s) Performed: XI ROBOTIC ASSISTED LAPAROSCOPIC CHOLECYSTECTOMY (N/A )  Patient Location: PACU  Anesthesia Type:General  Level of Consciousness: awake and drowsy  Airway & Oxygen Therapy: Patient Spontanous Breathing and Patient connected to face mask oxygen  Post-op Assessment: Report given to RN and Post -op Vital signs reviewed and stable  Post vital signs: Reviewed and stable  Last Vitals:  Vitals Value Taken Time  BP 124/81 03/22/20 1228  Temp    Pulse 98 03/22/20 1229  Resp 23 03/22/20 1229  SpO2 99 % 03/22/20 1229  Vitals shown include unvalidated device data.  Last Pain:  Vitals:   03/22/20 0927  TempSrc: Temporal  PainSc: 0-No pain      Patients Stated Pain Goal: 0 (03/21/20 0438)  Complications: No complications documented.

## 2020-03-22 NOTE — Consult Note (Signed)
Pharmacy Antibiotic Note  Matthew Stevenson is a 40 y.o. male admitted on 03/20/2020 with concern for  Intra-abdominal infection (cholecystitis).    Planned laparoscopic cholecystectomy this morning.   Pharmacy has been consulted for Zosyn dosing.  Plan: Continue Zosyn 3.375g IV q8h (4 hour infusion).  Height: 5\' 5"  (165.1 cm) Weight: 86.2 kg (190 lb) IBW/kg (Calculated) : 61.5  Temp (24hrs), Avg:98.8 F (37.1 C), Min:98.2 F (36.8 C), Max:99.2 F (37.3 C)  Recent Labs  Lab 03/20/20 1019 03/21/20 0450 03/22/20 0415  WBC 8.7 14.4* 15.5*  CREATININE 0.79 0.64 0.70    Estimated Creatinine Clearance: 124 mL/min (by C-G formula based on SCr of 0.7 mg/dL).    No Known Allergies  Antimicrobials this admission: Zosyn  (3/2 >>   Dose adjustments this admission: N/A  Microbiology results: N/A  Thank you for allowing pharmacy to be a part of this patient's care.  05/22/20, PharmD, BCPS Clinical Pharmacist 03/22/2020 10:33 AM

## 2020-03-22 NOTE — Plan of Care (Signed)
°  Problem: Education: Goal: Knowledge of General Education information will improve Description: Including pain rating scale, medication(s)/side effects and non-pharmacologic comfort measures Outcome: Progressing   Problem: Health Behavior/Discharge Planning: Goal: Ability to manage health-related needs will improve Outcome: Progressing   Problem: Activity: Goal: Risk for activity intolerance will decrease Outcome: Progressing   Problem: Education: Goal: Ability to describe self-care measures that may prevent or decrease complications (Diabetes Survival Skills Education) will improve Outcome: Progressing   Problem: Coping: Goal: Ability to adjust to condition or change in health will improve Outcome: Progressing

## 2020-03-22 NOTE — Progress Notes (Signed)
[facility] SURGICAL PROGRESS NOTE  Hospital Day(s): 2.   Post op day(s):  Matthew Stevenson   Interval History:  Patient seen and examined No acute events or new complaints overnight.  He continues to have persistent and slightly worse leukocytosis to 15.5K Maintaining renal function; sCr - 0.70 Still with hyperbilirubinemia to 2.7; may be secondary to edema given normal MRCP on 3/01  Vital signs in last 24 hours: [min-max] current  Temp:  [98.2 F (36.8 C)-101.3 F (38.5 C)] 99.2 F (37.3 C) (03/04 0024) Pulse Rate:  [81-91] 81 (03/04 0437) Resp:  [16-22] 22 (03/04 0437) BP: (116-134)/(68-78) 123/77 (03/04 0437) SpO2:  [97 %-98 %] 98 % (03/04 0437)     Height: 5\' 5"  (165.1 cm) Weight: 86.2 kg BMI (Calculated): 31.62   Intake/Output last 2 shifts:  03/03 0701 - 03/04 0700 In: 148.9 [IV Piggyback:148.9] Out: -    Physical Exam:  Constitutional: alert, cooperative and no distress  HENT: normocephalic without obvious abnormality  Eyes: PERRL, EOM's grossly intact and symmetric  Respiratory: breathing non-labored at rest  Cardiovascular: regular rate and sinus rhythm  Gastrointestinal: soft, non-tender, and non-distended, no rebound/guarding Musculoskeletal: no edema or wounds, motor and sensation grossly intact, NT   Labs:  CBC Latest Ref Rng & Units 03/22/2020 03/21/2020 03/20/2020  WBC 4.0 - 10.5 K/uL 15.5(H) 14.4(H) 8.7  Hemoglobin 13.0 - 17.0 g/dL 05/20/2020 24.2 35.3  Hematocrit 39.0 - 52.0 % 38.7(L) 42.5 43.8  Platelets 150 - 400 K/uL 269 260 244   CMP Latest Ref Rng & Units 03/22/2020 03/21/2020 03/20/2020  Glucose 70 - 99 mg/dL 05/20/2020) 431(V) 400(Q)  BUN 6 - 20 mg/dL 13 15 18   Creatinine 0.61 - 1.24 mg/dL 676(P 9.50  Sodium 135 - 145 mmol/L 131(L) 134(L) 131(L)  Potassium 3.5 - 5.1 mmol/L 3.8 3.7 3.7  Chloride 98 - 111 mmol/L 99 103 99  CO2 22 - 32 mmol/L 24 21(L) 21(L)  Calcium 8.9 - 10.3 mg/dL 8.3(L) 8.5(L) 9.2  Total Protein 6.5 - 8.1 g/dL 7.4 8.1 9.32)  Total  Bilirubin 0.3 - 1.2 mg/dL 2.7(H) 2.5(H) 2.2(H)  Alkaline Phos 38 - 126 U/L 197(H) 210(H) 231(H)  AST 15 - 41 U/L 50(H) 82(H) 104(H)  ALT 0 - 44 U/L 143(H) 200(H) 248(H)     Imaging studies: No new pertinent imaging studies   Assessment/Plan:  40 y.o. male with cholecystitis   - Plan for laparoscopic cholecystectomy this morning with Dr 2.4(P. He obtained informed consent yesterday (03/01).    - NPO + IVF   - IV ABx (Zosyn) - Monitor abdominal examination; on-going bowel function - Pain control prn; antiemetics prn   - Mobilization as tolerated - Further management per primary service    All of the above findings and recommendations were discussed with the patient, and the medical team, and all of patient's questions were answered to his expressed satisfaction.  -- Claudine Mouton, PA-C [facility] 03/22/2020, 7:11 AM [phone] M-F: 7am - 4pm

## 2020-03-22 NOTE — Anesthesia Postprocedure Evaluation (Signed)
Anesthesia Post Note  Patient: Matthew Stevenson  Procedure(s) Performed: XI ROBOTIC ASSISTED LAPAROSCOPIC CHOLECYSTECTOMY (N/A )  Patient location during evaluation: PACU Anesthesia Type: General Level of consciousness: awake and alert Pain management: pain level controlled Vital Signs Assessment: post-procedure vital signs reviewed and stable Respiratory status: spontaneous breathing and respiratory function stable Cardiovascular status: stable Anesthetic complications: no   No complications documented.   Last Vitals:  Vitals:   03/22/20 1300 03/22/20 1316  BP: 126/81 125/80  Pulse: 88 82  Resp: (!) 25 18  Temp: 37.3 C 37.1 C  SpO2: 96% 96%    Last Pain:  Vitals:   03/22/20 1316  TempSrc: Oral  PainSc:                  Anabeth Chilcott K

## 2020-03-22 NOTE — Progress Notes (Signed)
Inpatient Diabetes Program Recommendations  AACE/ADA: New Consensus Statement on Inpatient Glycemic Control (2015)  Target Ranges:  Prepandial:   less than 140 mg/dL      Peak postprandial:   less than 180 mg/dL (1-2 hours)      Critically ill patients:  140 - 180 mg/dL   Results for Matthew Stevenson, Matthew Stevenson (MRN [id number]) as of 03/22/2020 08:18  Ref. Range 03/21/2020 00:04 03/21/2020 05:38 03/21/2020 08:23 03/21/2020 12:10 03/21/2020 16:08 03/21/2020 20:48  Glucose-Capillary Latest Ref Range: 70 - 99 mg/dL 149 (H) 702 (H) 637 (H) 160 (H) 250 (H)  5 units NOVOLOG  181 (H)   Results for Matthew Stevenson, Matthew Stevenson (MRN [id number]) as of 03/22/2020 08:18  Ref. Range 03/22/2020 00:38 03/22/2020 04:35 03/22/2020 08:06  Glucose-Capillary Latest Ref Range: 70 - 99 mg/dL 412 (H) 878 (H) 676 (H)   Results for Matthew Stevenson, Matthew Stevenson (MRN [id number]) as of 03/22/2020 08:18  Ref. Range 03/20/2020 16:07  Hemoglobin A1C Latest Ref Range: 4.8 - 5.6 % 8.5 (H)  (197 mg/dl)    Admit with: Acute gallstone pancreatitis/ New Diagnosis of Diabetes (A1c= 8.5% with no known history of diabetes)  History: None noted  Current Insulin Orders: CBGs only Q4 hours    NPO for Lab Chole this AM    MD- Please consider starting Novolog Sensitive Correction Scale/ SSI (0-9 units) Q4 hours for in-hospital  Note plan to start Metformin at time of discharge home    --Will follow patient during hospitalization--  Ambrose Finland RN, MSN, CDE Diabetes Coordinator Inpatient Glycemic Control Team Team Pager: [phone] (8a-5p)

## 2020-03-22 NOTE — Progress Notes (Signed)
Admit with: Acute gallstone pancreatitis/ New Diagnosis of Diabetes (A1c= 8.5% with no known history of diabetes)  History: None noted  Current Insulin Orders: CBGs only Q4 hours    Met with pt at bedside using [employer] interpreter [place] [phone]  Spoke with pt about new diagnosis.  Discussed A1C results with him and explained what an A1C is, basic pathophysiology of DM Type 2, basic home care, basic diabetes diet nutrition principles, importance of checking CBGs and maintaining good CBG control to prevent long-term and short-term complications.  Reviewed signs and symptoms of hyperglycemia and hypoglycemia and how to treat hypoglycemia at home.  Also reviewed blood sugar goals and A1c goals for home.  Showed pt the free CBG meter kit provided by [facility] team and also showed pt the sticker on the box that gives the address of where to buy more lancets and strips for this meter.  Also stressed to pt the importance of regular follow up care with PCP for further diabetes management.  RNs to provide ongoing basic DM education at bedside with this patient.  Have ordered educational booklet and CBG meter teaching.  Have also placed RD consult for DM diet education for this patient.    --Will follow patient during hospitalization--  Wyn Quaker RN, MSN, CDE Diabetes Coordinator Inpatient Glycemic Control Team Team Pager: [phone] (8a-5p)

## 2020-03-22 NOTE — Progress Notes (Addendum)
TOC provided glucometer kit in patient's room and notified RN. Per [facility] consult, patient can get Metformin from [place] for $4 if Metformin is prescribed.   Patient is uninsured and interested in PCP/medication resources. Referral made to [facility]. Pharmacy updated to [facility]. Printed Photographer and asked RN to include it in [place] when [place].  Oleh Genin, [place]

## 2020-03-22 NOTE — Progress Notes (Signed)
Progress Note    Matthew Stevenson  JXB:[id number] DOB: 1980/10/11  DOA: 03/20/2020 PCP: Patient, No Pcp Per      Brief Narrative:    Medical records reviewed and are as summarized below:  Matthew Stevenson is a 40 y.o. male       Assessment/Plan:   Principal Problem:   Acute gallstone pancreatitis Active Problems:   Hyperglycemia   Acute cholecystitis   Transaminitis   Hyponatremia   Type 2 diabetes mellitus with complication, without long-term current use of insulin (HCC)   Body mass index is 31.62 kg/m.  (Obesity)   Fever, acute gallstone pancreatitis, acute cholecystitis: Continue analgesics.  He is n.p.o. for surgery.  IV fluids have been restarted for hydration.  Plan for lap cholecystectomy today.   Elevated liver enzymes: Improving.  Repeat liver enzymes tomorrow.  Type II DM with hyperglycemia: Hemoglobin A1c is 8.5.  Total cholesterol 168, HDL 25, LDL 149 triglycerides 143.  Metformin will be prescribed at discharge.   Plan to start NovoLog sliding scale with meals after surgery.  Hyponatremia: Restarted IV fluids.  Monitor BMP  Diet Order            Diet NPO time specified  Diet effective now                    Consultants:  General surgeon  Procedures:  Plan for cholecystectomy today    Medications:   . enoxaparin (LOVENOX) injection  40 mg Subcutaneous Q24H  . indocyanine green  2.5 mg Intravenous Once  . living well with diabetes book- in spanish   Does not apply Once  . pantoprazole (PROTONIX) IV  40 mg Intravenous Daily   Continuous Infusions: . lactated ringers    . piperacillin-tazobactam (ZOSYN)  IV 3.375 g (03/22/20 0536)     Anti-infectives (From admission, onward)   Start     Dose/Rate Route Frequency Ordered Stop   03/20/20 1530  piperacillin-tazobactam (ZOSYN) IVPB 3.375 g        3.375 g 12.5 mL/hr over 240 Minutes Intravenous Every 8 hours 03/20/20 1527               Family  Communication/Anticipated D/C date and plan/Code Status   DVT prophylaxis: Place and maintain sequential compression device Start: 03/22/20 0721 enoxaparin (LOVENOX) injection 40 mg Start: 03/20/20 2200     Code Status: Full Code  Family Communication: None Disposition Plan:    Status is: Inpatient  Remains inpatient appropriate because:IV treatments appropriate due to intensity of illness or inability to take PO   Dispo: The patient is from: Home              Anticipated d/c is to: Home              Patient currently is not medically stable to d/c.   Difficult to place patient No           Subjective:   No fever, abdominal pain, nausea or vomiting.  He feels better today.  Objective:    Vitals:   03/21/20 2132 03/22/20 0024 03/22/20 0437 03/22/20 0809  BP: 121/72 116/68 123/77 134/86  Pulse: 83 84 81 99  Resp: 20 20 (!) 22 18  Temp: 99.2 F (37.3 C) 99.2 F (37.3 C)  99 F (37.2 C)  TempSrc: Oral Oral  Oral  SpO2: 98% 98% 98% 100%  Weight:      Height:       No  data found.   Intake/Output Summary (Last 24 hours) at 03/22/2020 0850 Last data filed at 03/22/2020 0307 Gross per 24 hour  Intake 148.93 ml  Output --  Net 148.93 ml   Filed Weights   03/20/20 1018  Weight: 86.2 kg    Exam:  GEN: NAD SKIN: No rash EYES: EOMI ENT: MMM CV: RRR PULM: CTA B ABD: soft, ND, NT, +BS CNS: AAO x 3, non focal EXT: No edema or tenderness       Data Reviewed:   I have personally reviewed following labs and imaging studies:  Labs: Labs show the following:   Basic Metabolic Panel: Recent Labs  Lab 03/20/20 1019 03/21/20 0450 03/22/20 0415  NA 131* 134* 131*  K 3.7 3.7 3.8  CL 99 103 99  CO2 21* 21* 24  GLUCOSE 278* 163* 168*  BUN 18 15 13   CREATININE 0.79 0.64 0.70  CALCIUM 9.2 8.5* 8.3*   GFR Estimated Creatinine Clearance: 124 mL/min (by C-G formula based on SCr of 0.7 mg/dL). Liver Function Tests: Recent Labs  Lab 03/20/20 1019  03/21/20 0450 03/22/20 0415  AST 104* 82* 50*  ALT 248* 200* 143*  ALKPHOS 231* 210* 197*  BILITOT 2.2* 2.5* 2.7*  PROT 9.1* 8.1 7.4  ALBUMIN 4.3 3.6 3.1*   Recent Labs  Lab 03/20/20 1019 03/21/20 0450  LIPASE 246* 112*   No results for input(s): AMMONIA in the last 168 hours. Coagulation profile No results for input(s): INR, PROTIME in the last 168 hours.  CBC: Recent Labs  Lab 03/20/20 1019 03/21/20 0450 03/22/20 0415  WBC 8.7 14.4* 15.5*  HGB 15.3 14.1 13.5  HCT 43.8 42.5 38.7*  MCV 86.9 89.5 86.6  PLT 244 260 269   Cardiac Enzymes: No results for input(s): CKTOTAL, CKMB, CKMBINDEX, TROPONINI in the last 168 hours. BNP (last 3 results) No results for input(s): PROBNP in the last 8760 hours. CBG: Recent Labs  Lab 03/21/20 1608 03/21/20 2048 03/22/20 0038 03/22/20 0435 03/22/20 0806  GLUCAP 250* 181* 151* 167* 157*   D-Dimer: No results for input(s): DDIMER in the last 72 hours. Hgb A1c: Recent Labs    03/20/20 1607  HGBA1C 8.5*   Lipid Profile: Recent Labs    03/20/20 1019 03/22/20 0415  CHOL  --  168  HDL  --  25*  LDLCALC  --  114*  TRIG 299* 143  CHOLHDL  --  6.7   Thyroid function studies: No results for input(s): TSH, T4TOTAL, T3FREE, THYROIDAB in the last 72 hours.  Invalid input(s): FREET3 Anemia work up: No results for input(s): VITAMINB12, FOLATE, FERRITIN, TIBC, IRON, RETICCTPCT in the last 72 hours. Sepsis Labs: Recent Labs  Lab 03/20/20 1019 03/21/20 0450 03/22/20 0415  WBC 8.7 14.4* 15.5*    Microbiology Recent Results (from the past 240 hour(s))  Resp Panel by RT-PCR (Flu A&B, Covid) Nasopharyngeal Swab     Status: None   Collection Time: 03/20/20  1:26 PM   Specimen: Nasopharyngeal Swab; Nasopharyngeal(NP) swabs in vial transport medium  Result Value Ref Range Status   SARS Coronavirus 2 by RT PCR NEGATIVE NEGATIVE Final    Comment: (NOTE) SARS-CoV-2 target nucleic acids are NOT DETECTED.  The SARS-CoV-2 RNA is  generally detectable in upper respiratory specimens during the acute phase of infection. The lowest concentration of SARS-CoV-2 viral copies this assay can detect is 138 copies/mL. A negative result does not preclude SARS-Cov-2 infection and should not be used as the sole basis for treatment or  other patient management decisions. A negative result may occur with  improper specimen collection/handling, submission of specimen other than nasopharyngeal swab, presence of viral mutation(s) within the areas targeted by this assay, and inadequate number of viral copies(<138 copies/mL). A negative result must be combined with clinical observations, patient history, and epidemiological information. The expected result is Negative.  Fact Sheet for Patients:  BloggerCourse.com  Fact Sheet for Healthcare Providers:  SeriousBroker.it  This test is no t yet approved or cleared by the [place] FDA and  has been authorized for detection and/or diagnosis of SARS-CoV-2 by FDA under an Emergency Use Authorization (EUA). This EUA will remain  in effect (meaning this test can be used) for the duration of the COVID-19 declaration under Section 564(b)(1) of the Act, 21 U.S.C.section 360bbb-3(b)(1), unless the authorization is terminated  or revoked sooner.       Influenza A by PCR NEGATIVE NEGATIVE Final   Influenza B by PCR NEGATIVE NEGATIVE Final    Comment: (NOTE) The Xpert Xpress SARS-CoV-2/FLU/RSV plus assay is intended as an aid in the diagnosis of influenza from Nasopharyngeal swab specimens and should not be used as a sole basis for treatment. Nasal washings and aspirates are unacceptable for Xpert Xpress SARS-CoV-2/FLU/RSV testing.  Fact Sheet for Patients: BloggerCourse.com  Fact Sheet for Healthcare Providers: SeriousBroker.it  This test is not yet approved or cleared by the [place] FDA and has been authorized for detection and/or diagnosis of SARS-CoV-2 by FDA under an Emergency Use Authorization (EUA). This EUA will remain in effect (meaning this test can be used) for the duration of the COVID-19 declaration under Section 564(b)(1) of the Act, 21 U.S.C. section 360bbb-3(b)(1), unless the authorization is terminated or revoked.  Performed at [facility], [address] [place], [place] [address]     Procedures and diagnostic studies:  CT ABDOMEN PELVIS W CONTRAST  Result Date: 03/20/2020 CLINICAL DATA:  Abdominal pain.  Vomiting. EXAM: CT ABDOMEN AND PELVIS WITH CONTRAST TECHNIQUE: Multidetector CT imaging of the abdomen and pelvis was performed using the standard protocol following bolus administration of intravenous contrast. CONTRAST:  OMNIPAQUE IOHEXOL 300 MG/ML  SOLN COMPARISON:  None. FINDINGS: Lower chest: Unremarkable. Hepatobiliary: No suspicious focal abnormality within the liver parenchyma. Geographic fatty deposition noted. Gallbladder is distended but otherwise unremarkable. No intrahepatic or extrahepatic biliary dilation. Pancreas: No focal mass lesion. No dilatation of the main duct. No intraparenchymal cyst. No peripancreatic edema. Spleen: No splenomegaly. No focal mass lesion. Adrenals/Urinary Tract: No adrenal nodule or mass. Kidneys unremarkable. No evidence for hydroureter. The urinary bladder appears normal for the degree of distention. Stomach/Bowel: Stomach is nondistended. Duodenum is normally positioned as is the ligament of Treitz. No small bowel wall thickening. No small bowel dilatation. The terminal ileum is normal. The appendix is normal. No gross colonic mass. No colonic wall thickening. Vascular/Lymphatic: No abdominal aortic aneurysm. No abdominal aortic atherosclerotic calcification. Portal vein, superior mesenteric vein, and splenic vein are patent. No retroperitoneal or mesenteric lymphadenopathy. Upper normal lymph  nodes in the gastrohepatic and hepatoduodenal ligaments are most likely reactive. No pelvic sidewall lymphadenopathy. Reproductive: The prostate gland and seminal vesicles are unremarkable. Other: No intraperitoneal free fluid. Musculoskeletal: No worrisome lytic or sclerotic osseous abnormality. IMPRESSION: 1. No acute findings in the abdomen or pelvis. Specifically, no findings to explain the patient's history of abdominal pain and vomiting. 2. Geographic fatty deposition in the liver. Electronically Signed   By: Kennith Center M.D.   On: 03/20/2020 12:59  MR 3D Recon At Scanner  Result Date: 03/21/2020 : Please see Dr. Elly ModenaMichael Brown's report under accession [id number] [facility] Electronically Signed   By: Gaylyn RongWalter  Liebkemann M.D.   On: 03/21/2020 08:30   MR ABDOMEN MRCP W WO CONTAST  Result Date: 03/20/2020 CLINICAL DATA:  Cholelithiasis, pericholecystic edema on ultrasound EXAM: MRI ABDOMEN WITHOUT AND WITH CONTRAST (INCLUDING MRCP) TECHNIQUE: Multiplanar multisequence MR imaging of the abdomen was performed both before and after the administration of intravenous contrast. Heavily T2-weighted images of the biliary and pancreatic ducts were obtained, and three-dimensional MRCP images were rendered by post processing. CONTRAST:  8mL GADAVIST GADOBUTROL 1 MMOL/ML IV SOLN COMPARISON:  03/20/2020 FINDINGS: Lower chest: No acute pleural or parenchymal lung disease. Hepatobiliary: Multiple gallstones are seen filling the gallbladder lumen, largest measuring 18 mm. No gallbladder wall thickening or pericholecystic fluid. The cystic duct appears patent. Common bile duct measures up to 5 mm in diameter, with no evidence of choledocholithiasis. No intrahepatic duct dilation. The liver is unremarkable with no focal abnormality. Pancreas: No mass, inflammatory changes, or other parenchymal abnormality identified. Spleen:  Within normal limits in size and appearance. Adrenals/Urinary Tract: Renal parenchyma enhances normally.  No focal abnormalities. No evidence of obstructive uropathy. Bilateral adrenal glands are normal. Stomach/Bowel: No evidence of bowel obstruction or ileus. No bowel wall thickening. Vascular/Lymphatic: Normal caliber of the abdominal aorta. No pathologic adenopathy. Other:  No free fluid or free gas.  No abdominal wall hernia. Musculoskeletal: No acute or destructive bony lesions. IMPRESSION: 1. Cholelithiasis without cholecystitis. 2. Unremarkable MRCP with no biliary dilation or choledocholithiasis. Electronically Signed   By: Sharlet SalinaMichael  Brown M.D.   On: 03/20/2020 20:13   US ABDOMEN LIMITED RUQ (LIVER/GB)  Result Date: 03/20/2020 CLINICAL DATA:  Right upper quadrant abdominal pain and vomiting. EXAM: ULTRASOUND ABDOMEN LIMITED RIGHT UPPER QUADRANT COMPARISON:  CT of the abdomen and pelvis earlier today. FINDINGS: Gallbladder: Numerous echogenic calculi are seen within the gallbladder lumen. The gallbladder is mildly distended. There is some edema/fluid in the gallbladder fossa adjacent to the liver. No significant gallbladder wall thickening. Common bile duct: Diameter: 6 mm Liver: The liver demonstrates coarse echotexture and increased echogenicity, likely reflecting diffuse steatosis. No overt cirrhotic contour abnormalities or focal lesions are identified. There is no evidence of intrahepatic biliary ductal dilatation. The portal vein is patent on color Doppler imaging with normal direction of blood flow towards the liver. Other: None. IMPRESSION: Cholelithiasis with numerous gallstones present. There is suggestion of edema in the gallbladder fossa between the gallbladder and liver consistent with acute inflammation. There is no overt gallbladder wall thickening. Electronically Signed   By: Irish LackGlenn  Yamagata M.D.   On: 03/20/2020 14:46               LOS: 2 days   Kyanne Rials  [facility]   Pager on [email or web address]. If 7PM-7AM, please contact night-coverage at  [email or web address]     03/22/2020, 8:50 AM

## 2020-03-22 NOTE — Plan of Care (Signed)
  RD consulted for nutrition education regarding diabetes.   Lab Results  Component Value Date   HGBA1C 8.5 (H) 03/20/2020    40 year old Spanish-speaking male admitted with new diagnosis of DM, acute calculus cholecystitis s/p robotic assisted laparoscopic cholecystectomy on 3/4, acute pancreatitis.  Met with patient and family member at bedside with use of video interpreter [employer] account executive Interpreter [phone]). Patient reports he has good appetite and intake at baseline. He typically eats 2 meals per day. He may have pizza, cheese burger, meat with rice and beans. He reports he used to drink a lot of soda and he has now learned that it increases his blood sugar. He has been drinking water here in the hospital and reports he will drink more water instead of soda. Patient just had operation today. He reports he is hungry and is tolerating clear liquids well.   RD provided "Carbohydrate Counting for People with Diabetes" handout from the Academy of Nutrition and Dietetics. Discussed different food groups and their effects on blood sugar, emphasizing carbohydrate-containing foods. Provided list of carbohydrates and recommended serving sizes of common foods.  Discussed importance of controlled and consistent carbohydrate intake throughout the day. Provided examples of ways to balance meals/snacks and encouraged intake of high-fiber, whole grain complex carbohydrates. Teach back method used.  Expect good compliance.  Body mass index is 31.62 kg/m. Pt meets criteria for obesity class I based on current BMI.  Current diet order is clear liquid diet, patient is consuming approximately 100% of meals at this time. Labs and medications reviewed. No further nutrition interventions warranted at this time. RD contact information provided. If additional nutrition issues arise, please re-consult RD.  Jacklynn Barnacle, MS, RD, LDN Pager number available on Amion

## 2020-03-22 NOTE — Anesthesia Procedure Notes (Signed)
Procedure Name: Intubation Date/Time: 03/22/2020 10:15 AM Performed by: Ginger Carne, CRNA Pre-anesthesia Checklist: Patient identified, Emergency Drugs available, Suction available, Patient being monitored and Timeout performed Patient Re-evaluated:Patient Re-evaluated prior to induction Oxygen Delivery Method: Circle system utilized Preoxygenation: Pre-oxygenation with 100% oxygen Induction Type: IV induction Ventilation: Mask ventilation without difficulty Laryngoscope Size: McGraph and 3 Grade View: Grade I Tube type: Oral Tube size: 7.0 mm Number of attempts: 1 Airway Equipment and Method: Stylet and Video-laryngoscopy Placement Confirmation: ETT inserted through vocal cords under direct vision,  positive ETCO2 and breath sounds checked- equal and bilateral Secured at: 20 cm Tube secured with: Tape Dental Injury: Teeth and Oropharynx as per pre-operative assessment

## 2020-03-22 NOTE — Progress Notes (Signed)
Pre-op interview conducted with assistance of Spanish interpreter via [employer]

## 2020-03-23 LAB — CBC WITH DIFFERENTIAL/PLATELET
Abs Immature Granulocytes: 0.32 10*3/uL — ABNORMAL HIGH (ref 0.00–0.07)
Basophils Absolute: 0 10*3/uL (ref 0.0–0.1)
Basophils Relative: 0 %
Eosinophils Absolute: 0 10*3/uL (ref 0.0–0.5)
Eosinophils Relative: 0 %
HCT: 34.8 % — ABNORMAL LOW (ref 39.0–52.0)
Hemoglobin: 11.7 g/dL — ABNORMAL LOW (ref 13.0–17.0)
Immature Granulocytes: 3 %
Lymphocytes Relative: 16 %
Lymphs Abs: 1.8 10*3/uL (ref 0.7–4.0)
MCH: 30 pg (ref 26.0–34.0)
MCHC: 33.6 g/dL (ref 30.0–36.0)
MCV: 89.2 fL (ref 80.0–100.0)
Monocytes Absolute: 1 10*3/uL (ref 0.1–1.0)
Monocytes Relative: 8 %
Neutro Abs: 8.2 10*3/uL — ABNORMAL HIGH (ref 1.7–7.7)
Neutrophils Relative %: 73 %
Platelets: 312 10*3/uL (ref 150–400)
RBC: 3.9 MIL/uL — ABNORMAL LOW (ref 4.22–5.81)
RDW: 13.2 % (ref 11.5–15.5)
WBC: 11.4 10*3/uL — ABNORMAL HIGH (ref 4.0–10.5)
nRBC: 0 % (ref 0.0–0.2)

## 2020-03-23 LAB — COMPREHENSIVE METABOLIC PANEL
ALT: 107 U/L — ABNORMAL HIGH (ref 0–44)
AST: 41 U/L (ref 15–41)
Albumin: 2.7 g/dL — ABNORMAL LOW (ref 3.5–5.0)
Alkaline Phosphatase: 170 U/L — ABNORMAL HIGH (ref 38–126)
Anion gap: 9 (ref 5–15)
BUN: 14 mg/dL (ref 6–20)
CO2: 22 mmol/L (ref 22–32)
Calcium: 8.2 mg/dL — ABNORMAL LOW (ref 8.9–10.3)
Chloride: 102 mmol/L (ref 98–111)
Creatinine, Ser: 0.63 mg/dL (ref 0.61–1.24)
GFR, Estimated: >60 mL/min (ref >60)
Glucose, Bld: 148 mg/dL — ABNORMAL HIGH (ref 70–99)
Potassium: 3.6 mmol/L (ref 3.5–5.1)
Sodium: 133 mmol/L — ABNORMAL LOW (ref 135–145)
Total Bilirubin: 1.7 mg/dL — ABNORMAL HIGH (ref 0.3–1.2)
Total Protein: 7 g/dL (ref 6.5–8.1)

## 2020-03-23 LAB — GLUCOSE, CAPILLARY
Glucose-Capillary: 132 mg/dL — ABNORMAL HIGH (ref 70–99)
Glucose-Capillary: 139 mg/dL — ABNORMAL HIGH (ref 70–99)
Glucose-Capillary: 160 mg/dL — ABNORMAL HIGH (ref 70–99)
Glucose-Capillary: 154 mg/dL — ABNORMAL HIGH (ref 70–99)

## 2020-03-23 MED ORDER — IBUPROFEN 800 MG PO TABS: 800.0000 mg | ORAL_TABLET | Freq: Three times a day (TID) | ORAL | 0 refills | Status: DC | PRN

## 2020-03-23 MED ORDER — DOCUSATE SODIUM 100 MG PO CAPS: 100.0000 mg | ORAL_CAPSULE | Freq: Two times a day (BID) | ORAL | 0 refills | Status: AC | PRN

## 2020-03-23 MED ORDER — ACETAMINOPHEN 325 MG PO TABS: 650.0000 mg | ORAL_TABLET | Freq: Three times a day (TID) | ORAL | 0 refills | Status: AC | PRN

## 2020-03-23 MED ORDER — HYDROCODONE-ACETAMINOPHEN 5-325 MG PO TABS: 1.0000 | ORAL_TABLET | Freq: Four times a day (QID) | ORAL | 0 refills | Status: DC | PRN

## 2020-03-23 MED ORDER — INSULIN ASPART 100 UNIT/ML ~~LOC~~ SOLN
0.0000 [IU] | Freq: Three times a day (TID) | SUBCUTANEOUS | Status: DC
  Administered 2020-03-23: 3 [IU] via SUBCUTANEOUS
  Administered 2020-03-24: 2 [IU] via SUBCUTANEOUS
  Administered 2020-03-24: 12:00:00 3 [IU] via SUBCUTANEOUS
  Filled 2020-03-23 (×3): qty 1

## 2020-03-23 NOTE — Progress Notes (Signed)
Progress Note    Matthew Stevenson  WPV:[id number] DOB: 1980-07-15  DOA: 03/20/2020 PCP: Patient, No Pcp Per      Brief Narrative:    Medical records reviewed and are as summarized below:  Matthew Stevenson is a 40 y.o. male       Assessment/Plan:   Principal Problem:   Acute gallstone pancreatitis Active Problems:   Hyperglycemia   Acute cholecystitis   Transaminitis   Hyponatremia   Type 2 diabetes mellitus with complication, without long-term current use of insulin (HCC)   Body mass index is 31.62 kg/m.  (Obesity)   Fever, acute gallstone pancreatitis, acute cholecystitis: s/p robotic assisted laparoscopic cholecystectomy on 03/22/2020.continue analgesics.  Follow-up with general surgeon for further recommendations.    Elevated liver enzymes: Proved  Type II DM with hyperglycemia: Hemoglobin A1c is 8.5.  Total cholesterol 168, HDL 25, LDL 149 triglycerides 143.  NovoLog as needed for hyperglycemia metformin will be prescribed at discharge.    Hyponatremia: Sodium level is improving.  Discontinue IV fluids.  Diet Order            Diet clear liquid Room service appropriate? Yes; Fluid consistency: Thin  Diet effective now                    Consultants:  General surgeon  Procedures:  S/p lap cholecystectomy on 03/22/2020    Medications:   . enoxaparin (LOVENOX) injection  40 mg Subcutaneous Q24H  . insulin aspart  0-15 Units Subcutaneous TID WC  . ketorolac  30 mg Intravenous Q6H  . living well with diabetes book- in spanish   Does not apply Once  . pantoprazole (PROTONIX) IV  40 mg Intravenous Daily   Continuous Infusions: . piperacillin-tazobactam (ZOSYN)  IV 3.375 g (03/23/20 1425)     Anti-infectives (From admission, onward)   Start     Dose/Rate Route Frequency Ordered Stop   03/20/20 1530  piperacillin-tazobactam (ZOSYN) IVPB 3.375 g        3.375 g 12.5 mL/hr over 240 Minutes Intravenous Every 8 hours 03/20/20 1527                Family Communication/Anticipated D/C date and plan/Code Status   DVT prophylaxis: Place and maintain sequential compression device Start: 03/22/20 0721 enoxaparin (LOVENOX) injection 40 mg Start: 03/20/20 2200     Code Status: Full Code  Family Communication: None Disposition Plan:    Status is: Inpatient  Remains inpatient appropriate because:IV treatments appropriate due to intensity of illness or inability to take PO   Dispo: The patient is from: Home              Anticipated d/c is to: Home              Patient currently is not medically stable to d/c.   Difficult to place patient No           Subjective:   C/o abdominal pain.  No vomiting.  Objective:    Vitals:   03/22/20 2318 03/23/20 0402 03/23/20 0745 03/23/20 1152  BP: 116/71 114/61 113/67 115/61  Pulse: 83 71 79 80  Resp: 16 16 16 18   Temp: 98.2 F (36.8 C) 98.7 F (37.1 C) 98.6 F (37 C) 98.2 F (36.8 C)  TempSrc:  Oral Oral   SpO2: 97% 96% 92% 96%  Weight:      Height:       No data found.   Intake/Output Summary (Last  24 hours) at 03/23/2020 1430 Last data filed at 03/23/2020 1044 Gross per 24 hour  Intake 1904.61 ml  Output 1490 ml  Net 414.61 ml   Filed Weights   03/20/20 1018  Weight: 86.2 kg    Exam:  GEN: NAD SKIN: No rash EYES: EOMI ENT: MMM CV: RRR PULM: CTA B ABD: soft, ND, right upper abdominal tenderness, no rebound tenderness or guarding, + JP drain in place with serosanguineous fluid, +BS CNS: AAO x 3, non focal EXT: No edema or tenderness        Data Reviewed:   I have personally reviewed following labs and imaging studies:  Labs: Labs show the following:   Basic Metabolic Panel: Recent Labs  Lab 03/20/20 1019 03/21/20 0450 03/22/20 0415 03/23/20 0418  NA 131* 134* 131* 133*  K 3.7 3.7 3.8 3.6  CL 99 103 99 102  CO2 21* 21* 24 22  GLUCOSE 278* 163* 168* 148*  BUN 18 15 13 14   CREATININE 0.79 0.64 0.70 0.63  CALCIUM 9.2  8.5* 8.3* 8.2*   GFR Estimated Creatinine Clearance: 124 mL/min (by C-G formula based on SCr of 0.63 mg/dL). Liver Function Tests: Recent Labs  Lab 03/20/20 1019 03/21/20 0450 03/22/20 0415 03/23/20 0418  AST 104* 82* 50* 41  ALT 248* 200* 143* 107*  ALKPHOS 231* 210* 197* 170*  BILITOT 2.2* 2.5* 2.7* 1.7*  PROT 9.1* 8.1 7.4 7.0  ALBUMIN 4.3 3.6 3.1* 2.7*   Recent Labs  Lab 03/20/20 1019 03/21/20 0450  LIPASE 246* 112*   No results for input(s): AMMONIA in the last 168 hours. Coagulation profile No results for input(s): INR, PROTIME in the last 168 hours.  CBC: Recent Labs  Lab 03/20/20 1019 03/21/20 0450 03/22/20 0415 03/23/20 0418  WBC 8.7 14.4* 15.5* 11.4*  NEUTROABS  --   --   --  8.2*  HGB 15.3 14.1 13.5 11.7*  HCT 43.8 42.5 38.7* 34.8*  MCV 86.9 89.5 86.6 89.2  PLT 244 260 269 312   Cardiac Enzymes: No results for input(s): CKTOTAL, CKMB, CKMBINDEX, TROPONINI in the last 168 hours. BNP (last 3 results) No results for input(s): PROBNP in the last 8760 hours. CBG: Recent Labs  Lab 03/22/20 1358 03/22/20 1613 03/22/20 2105 03/23/20 0746 03/23/20 1153  GLUCAP 168* 206* 144* 132* 139*   D-Dimer: No results for input(s): DDIMER in the last 72 hours. Hgb A1c: Recent Labs    03/20/20 1607  HGBA1C 8.5*   Lipid Profile: Recent Labs    03/22/20 0415  CHOL 168  HDL 25*  LDLCALC 114*  TRIG 143  CHOLHDL 6.7   Thyroid function studies: No results for input(s): TSH, T4TOTAL, T3FREE, THYROIDAB in the last 72 hours.  Invalid input(s): FREET3 Anemia work up: No results for input(s): VITAMINB12, FOLATE, FERRITIN, TIBC, IRON, RETICCTPCT in the last 72 hours. Sepsis Labs: Recent Labs  Lab 03/20/20 1019 03/21/20 0450 03/22/20 0415 03/23/20 0418  WBC 8.7 14.4* 15.5* 11.4*    Microbiology Recent Results (from the past 240 hour(s))  Resp Panel by RT-PCR (Flu A&B, Covid) Nasopharyngeal Swab     Status: None   Collection Time: 03/20/20  1:26 PM    Specimen: Nasopharyngeal Swab; Nasopharyngeal(NP) swabs in vial transport medium  Result Value Ref Range Status   SARS Coronavirus 2 by RT PCR NEGATIVE NEGATIVE Final    Comment: (NOTE) SARS-CoV-2 target nucleic acids are NOT DETECTED.  The SARS-CoV-2 RNA is generally detectable in upper respiratory specimens during the acute phase  of infection. The lowest concentration of SARS-CoV-2 viral copies this assay can detect is 138 copies/mL. A negative result does not preclude SARS-Cov-2 infection and should not be used as the sole basis for treatment or other patient management decisions. A negative result may occur with  improper specimen collection/handling, submission of specimen other than nasopharyngeal swab, presence of viral mutation(s) within the areas targeted by this assay, and inadequate number of viral copies(<138 copies/mL). A negative result must be combined with clinical observations, patient history, and epidemiological information. The expected result is Negative.  Fact Sheet for Patients:  BloggerCourse.com  Fact Sheet for Healthcare Providers:  SeriousBroker.it  This test is no t yet approved or cleared by the [place] FDA and  has been authorized for detection and/or diagnosis of SARS-CoV-2 by FDA under an Emergency Use Authorization (EUA). This EUA will remain  in effect (meaning this test can be used) for the duration of the COVID-19 declaration under Section 564(b)(1) of the Act, 21 U.S.C.section 360bbb-3(b)(1), unless the authorization is terminated  or revoked sooner.       Influenza A by PCR NEGATIVE NEGATIVE Final   Influenza B by PCR NEGATIVE NEGATIVE Final    Comment: (NOTE) The Xpert Xpress SARS-CoV-2/FLU/RSV plus assay is intended as an aid in the diagnosis of influenza from Nasopharyngeal swab specimens and should not be used as a sole basis for treatment. Nasal washings and aspirates are  unacceptable for Xpert Xpress SARS-CoV-2/FLU/RSV testing.  Fact Sheet for Patients: BloggerCourse.com  Fact Sheet for Healthcare Providers: SeriousBroker.it  This test is not yet approved or cleared by the [place] FDA and has been authorized for detection and/or diagnosis of SARS-CoV-2 by FDA under an Emergency Use Authorization (EUA). This EUA will remain in effect (meaning this test can be used) for the duration of the COVID-19 declaration under Section 564(b)(1) of the Act, 21 U.S.C. section 360bbb-3(b)(1), unless the authorization is terminated or revoked.  Performed at [facility], [address] [place], [place] [address]     Procedures and diagnostic studies:  No results found.             LOS: 3 days   Jaece Ducharme  [facility]   Pager on [email or web address]. If 7PM-7AM, please contact night-coverage at [email or web address]     03/23/2020, 2:30 PM

## 2020-03-23 NOTE — Discharge Instructions (Signed)
Laparoscopic Cholecystectomy, Care After This sheet gives you information about how to care for yourself after your procedure. Your doctor may also give you more specific instructions. If you have problems or questions, contact your doctor. Follow these instructions at home: Care for cuts from surgery (incisions)   Follow instructions from your doctor about how to take care of your cuts from surgery. Make sure you: ? Wash your hands with soap and water before you change your bandage (dressing). If you cannot use soap and water, use hand sanitizer. ? Change your bandage as told by your doctor. ? Leave stitches (sutures), skin glue, or skin tape (adhesive) strips in place. They may need to stay in place for 2 weeks or longer. If tape strips get loose and curl up, you may trim the loose edges. Do not remove tape strips completely unless your doctor says it is okay.  Do not take baths, swim, or use a hot tub until your doctor says it is okay. OK TO SHOWER 24HRS AFTER YOUR SURGERY.   Check your surgical cut area every day for signs of infection. Check for: ? More redness, swelling, or pain. ? More fluid or blood. ? Warmth. ? Pus or a bad smell. Activity  Do not drive or use heavy machinery while taking prescription pain medicine.  Do not play contact sports until your doctor says it is okay.  Do not drive for 24 hours if you were given a medicine to help you relax (sedative).  Rest as needed. Do not return to work or school until your doctor says it is okay. General instructions . Measure JP drain output daily, bring to next f/u appt .  tylenol and advil as needed for discomfort.  Please alternate between the two every four hours as needed for pain.   .  Use narcotics, if prescribed, only when tylenol and motrin is not enough to control pain. .  325-650mg  every 8hrs to max of 3000mg /24hrs (including the 325mg  in every norco dose) for the tylenol.   .  Advil up to 800mg  per dose every 8hrs  as needed for pain.    To prevent or treat constipation while you are taking prescription pain medicine, your doctor may recommend that you: ? Drink enough fluid to keep your pee (urine) clear or pale yellow. ? Take over-the-counter or prescription medicines. ? Eat foods that are high in fiber, such as fresh fruits and vegetables, whole grains, and beans. ? Limit foods that are high in fat and processed sugars, such as fried and sweet foods. Contact a doctor if:  You develop a rash.  You have more redness, swelling, or pain around your surgical cuts.  You have more fluid or blood coming from your surgical cuts.  Your surgical cuts feel warm to the touch.  You have pus or a bad smell coming from your surgical cuts.  You have a fever.  One or more of your surgical cuts breaks open. Get help right away if:  You have trouble breathing.  You have chest pain.  You have pain that is getting worse in your shoulders.  You faint or feel dizzy when you stand.  You have very bad pain in your belly (abdomen).  You are sick to your stomach (nauseous) for more than one day.  You have throwing up (vomiting) that lasts for more than one day.  You have leg pain. This information is not intended to replace advice given to you by your health care  provider. Make sure you discuss any questions you have with your health care provider. Document Released: 10/15/2007 Document Revised: 07/27/2015 Document Reviewed: 06/24/2015 Elsevier Interactive Patient Education  2019 [employer].

## 2020-03-23 NOTE — Progress Notes (Signed)
Subjective:  CC: Matthew Stevenson is a 40 y.o. male  Hospital stay day 3, 1 Day Post-Op lap chole  HPI: No issues overnight.  Drain with serosanguinous output  ROS:  General: Denies weight loss, weight gain, fatigue, fevers, chills, and night sweats. Heart: Denies chest pain, palpitations, racing heart, irregular heartbeat, leg pain or swelling, and decreased activity tolerance. Respiratory: Denies breathing difficulty, shortness of breath, wheezing, cough, and sputum. GI: Denies change in appetite, heartburn, nausea, vomiting, constipation, diarrhea, and blood in stool. GU: Denies difficulty urinating, pain with urinating, urgency, frequency, blood in urine.   Objective:   Temp:  [97.8 F (36.6 C)-98.7 F (37.1 C)] 97.8 F (36.6 C) (03/05 1534) Pulse Rate:  [71-83] 83 (03/05 1534) Resp:  [16-18] 16 (03/05 1534) BP: (113-120)/(61-77) 120/77 (03/05 1534) SpO2:  [92 %-98 %] 95 % (03/05 1534)     Height: 5\' 5"  (165.1 cm) Weight: 86.2 kg BMI (Calculated): 31.62   Intake/Output this shift:   Intake/Output Summary (Last 24 hours) at 03/23/2020 1842 Last data filed at 03/23/2020 1837 Gross per 24 hour  Intake 2414.65 ml  Output 2315 ml  Net 99.65 ml    Constitutional :  alert, cooperative, appears stated age and no distress  Respiratory:  clear to auscultation bilaterally  Cardiovascular:  regular rate and rhythm  Gastrointestinal: soft, non-tender; bowel sounds normal; no masses,  no organomegaly. JP with serosanguinsous drainage  Skin: Cool and moist. Incisions c/d/i  Psychiatric: Normal affect, non-agitated, not confused       LABS:  CMP Latest Ref Rng & Units 03/23/2020 03/22/2020 03/21/2020  Glucose 70 - 99 mg/dL 05/21/2020) 570(V) 779(T)  BUN 6 - 20 mg/dL 14 13 15   Creatinine 0.61 - 1.24 mg/dL 903(E 0.92  Sodium 135 - 145 mmol/L 133(L) 131(L) 134(L)  Potassium 3.5 - 5.1 mmol/L 3.6 3.8 3.7  Chloride 98 - 111 mmol/L 102 99 103  CO2 22 - 32 mmol/L 22 24 21(L)  Calcium 8.9 -  10.3 mg/dL 8.2(L) 8.3(L) 8.5(L)  Total Protein 6.5 - 8.1 g/dL 7.0 7.4 8.1  Total Bilirubin 0.3 - 1.2 mg/dL 3.30) 2.7(H) 2.5(H)  Alkaline Phos 38 - 126 U/L 170(H) 197(H) 210(H)  AST 15 - 41 U/L 41 50(H) 82(H)  ALT 0 - 44 U/L 107(H) 143(H) 200(H)   CBC Latest Ref Rng & Units 03/23/2020 03/22/2020 03/21/2020  WBC 4.0 - 10.5 K/uL 11.4(H) 15.5(H) 14.4(H)  Hemoglobin 13.0 - 17.0 g/dL 11.7(L) 13.5 14.1  Hematocrit 39.0 - 52.0 % 34.8(L) 38.7(L) 42.5  Platelets 150 - 400 K/uL 312 269 260    RADS: n/a Assessment:   S/p lap chole.  Doing well.  Ok to d/c from surgery standpoint after RN education for newly diagnosed DM completed.  Record drain output and f/u [place] one week.

## 2020-03-23 NOTE — Plan of Care (Signed)
Patient tolerates carb diet.  Reports of minimal pain (2/10), did not require pain medication.  No BM yet.  Passing gas.  Ambulated around the nursing unit few times during the shift.  Educated about diabetes using an interpretor.  Verbalized understanding, but needs reinforcement.

## 2020-03-24 ENCOUNTER — Other Ambulatory Visit: Payer: Self-pay | Admitting: Internal Medicine

## 2020-03-24 LAB — GLUCOSE, CAPILLARY
Glucose-Capillary: 132 mg/dL — ABNORMAL HIGH (ref 70–99)
Glucose-Capillary: 127 mg/dL — ABNORMAL HIGH (ref 70–99)
Glucose-Capillary: 188 mg/dL — ABNORMAL HIGH (ref 70–99)

## 2020-03-24 MED ORDER — COVID-19 MRNA VAC-TRIS(PFIZER) 30 MCG/0.3ML IM SUSP
0.3000 mL | Freq: Once | INTRAMUSCULAR | Status: AC
  Administered 2020-03-24: 0.3 mL via INTRAMUSCULAR
  Filled 2020-03-24: qty 0.3

## 2020-03-24 MED ORDER — SILVER NITRATE-POT NITRATE 75-25 % EX MISC
1.0000 | Freq: Once | CUTANEOUS | Status: AC
  Administered 2020-03-24: 1 via TOPICAL
  Filled 2020-03-24: qty 1

## 2020-03-24 MED ORDER — METFORMIN HCL 500 MG PO TABS: 500.0000 mg | ORAL_TABLET | Freq: Two times a day (BID) | ORAL | 0 refills | Status: DC

## 2020-03-24 NOTE — Progress Notes (Addendum)
Dressing to R lower abdomen incision saturated with sanguineous drainage.  Dry gauze applied.  Last night the nurse had to change it once for same.  Secured text message sent to Dr. Tonna Boehringer. No new orders at this time.

## 2020-03-24 NOTE — Discharge Summary (Addendum)
Physician Discharge Summary  Matthew Stevenson XHB:[id number] DOB: 1980-05-09 DOA: 03/20/2020  PCP: Patient, No Pcp Per  Admit date: 03/20/2020 Discharge date: 03/24/2020  Discharge disposition: Home   Recommendations for Outpatient Follow-Up:   Advised to follow-up with PCP for routine health maintenance. Follow-up with Dr. Claudine Mouton in 1 week   Discharge Diagnosis:   Principal Problem:   Acute gallstone pancreatitis Active Problems:   Hyperglycemia   Acute cholecystitis   Transaminitis   Hyponatremia   Type 2 diabetes mellitus with complication, without long-term current use of insulin (HCC)    Discharge Condition: Stable.  Diet recommendation:  Diet Order            Diet - low sodium heart healthy           Diet Carb Modified           Diet Carb Modified Fluid consistency: Thin; Room service appropriate? Yes  Diet effective now                   Code Status: Full Code     Hospital Course:   [name] is a 40 y.o. male  with medical history significant for obesity who presented to the hospital because of abdominal pain, nausea and vomiting.  He was admitted to the hospital for acute calculus cholecystitis and acute gallstone pancreatitis.  He was treated with bowel rest, IV fluids, analgesics and antiemetics.  General surgeon was consulted to assist with management.  He underwent robotic assisted laparoscopic cholecystectomy on 03/22/2020.  He was also found to have type 2 diabetes mellitus with hyperglycemia.  Hemoglobin A1c was 8.5.  Patient has been educated on the management and complications of diabetes mellitus.  The importance of medical adherence was emphasized.  Lifestyle changes including healthy eating habits, weight loss and regular exercise were recommended.  He has been started on Metformin for glucose control.  He has been provided with a glucometer so he can monitor his glucose levels at home.  He requested Pfizer COVID-19 vaccine  and this was given on the day of discharge.  His condition has improved and he is deemed stable for discharge to home today.    Medical Consultants:    General surgeon for lap cholecystectomy   Discharge Exam:    Vitals:   03/23/20 2025 03/23/20 2332 03/24/20 0448 03/24/20 0752  BP: 114/75 109/71 108/71 120/77  Pulse: 73 78 62 63  Resp: 15 17 16 18   Temp: 98.3 F (36.8 C) 98.6 F (37 C) 98.2 F (36.8 C) 98.2 F (36.8 C)  TempSrc: Oral Oral Oral   SpO2: 97% 95% 96% 96%  Weight:      Height:         GEN: NAD SKIN: No rash EYES: EOMI ENT: MMM CV: RRR PULM: CTA B ABD: soft, ND, mild surgical tenderness, +BS, + JP drain with serosanguineous fluid CNS: AAO x 3, non focal EXT: No edema or tenderness   The results of significant diagnostics from this hospitalization (including imaging, microbiology, ancillary and laboratory) are listed below for reference.     Procedures and Diagnostic Studies:   CT ABDOMEN PELVIS W CONTRAST  Result Date: 03/20/2020 CLINICAL DATA:  Abdominal pain.  Vomiting. EXAM: CT ABDOMEN AND PELVIS WITH CONTRAST TECHNIQUE: Multidetector CT imaging of the abdomen and pelvis was performed using the standard protocol following bolus administration of intravenous contrast. CONTRAST:  05/20/2020 OMNIPAQUE IOHEXOL 300 MG/ML  SOLN COMPARISON:  None. FINDINGS: Lower chest:  Unremarkable. Hepatobiliary: No suspicious focal abnormality within the liver parenchyma. Geographic fatty deposition noted. Gallbladder is distended but otherwise unremarkable. No intrahepatic or extrahepatic biliary dilation. Pancreas: No focal mass lesion. No dilatation of the main duct. No intraparenchymal cyst. No peripancreatic edema. Spleen: No splenomegaly. No focal mass lesion. Adrenals/Urinary Tract: No adrenal nodule or mass. Kidneys unremarkable. No evidence for hydroureter. The urinary bladder appears normal for the degree of distention. Stomach/Bowel: Stomach is nondistended. Duodenum is  normally positioned as is the ligament of Treitz. No small bowel wall thickening. No small bowel dilatation. The terminal ileum is normal. The appendix is normal. No gross colonic mass. No colonic wall thickening. Vascular/Lymphatic: No abdominal aortic aneurysm. No abdominal aortic atherosclerotic calcification. Portal vein, superior mesenteric vein, and splenic vein are patent. No retroperitoneal or mesenteric lymphadenopathy. Upper normal lymph nodes in the gastrohepatic and hepatoduodenal ligaments are most likely reactive. No pelvic sidewall lymphadenopathy. Reproductive: The prostate gland and seminal vesicles are unremarkable. Other: No intraperitoneal free fluid. Musculoskeletal: No worrisome lytic or sclerotic osseous abnormality. IMPRESSION: 1. No acute findings in the abdomen or pelvis. Specifically, no findings to explain the patient's history of abdominal pain and vomiting. 2. Geographic fatty deposition in the liver. Electronically Signed   By: Kennith Center M.D.   On: 03/20/2020 12:59   MR 3D Recon At Scanner  Result Date: 03/21/2020 : Please see Dr. Elly Modena report under accession [id number] [facility] Electronically Signed   By: Gaylyn Rong M.D.   On: 03/21/2020 08:30   MR ABDOMEN MRCP W WO CONTAST  Result Date: 03/20/2020 CLINICAL DATA:  Cholelithiasis, pericholecystic edema on ultrasound EXAM: MRI ABDOMEN WITHOUT AND WITH CONTRAST (INCLUDING MRCP) TECHNIQUE: Multiplanar multisequence MR imaging of the abdomen was performed both before and after the administration of intravenous contrast. Heavily T2-weighted images of the biliary and pancreatic ducts were obtained, and three-dimensional MRCP images were rendered by post processing. CONTRAST:  58mL GADAVIST GADOBUTROL 1 MMOL/ML IV SOLN COMPARISON:  03/20/2020 FINDINGS: Lower chest: No acute pleural or parenchymal lung disease. Hepatobiliary: Multiple gallstones are seen filling the gallbladder lumen, largest measuring 18 mm. No  gallbladder wall thickening or pericholecystic fluid. The cystic duct appears patent. Common bile duct measures up to 5 mm in diameter, with no evidence of choledocholithiasis. No intrahepatic duct dilation. The liver is unremarkable with no focal abnormality. Pancreas: No mass, inflammatory changes, or other parenchymal abnormality identified. Spleen:  Within normal limits in size and appearance. Adrenals/Urinary Tract: Renal parenchyma enhances normally. No focal abnormalities. No evidence of obstructive uropathy. Bilateral adrenal glands are normal. Stomach/Bowel: No evidence of bowel obstruction or ileus. No bowel wall thickening. Vascular/Lymphatic: Normal caliber of the abdominal aorta. No pathologic adenopathy. Other:  No free fluid or free gas.  No abdominal wall hernia. Musculoskeletal: No acute or destructive bony lesions. IMPRESSION: 1. Cholelithiasis without cholecystitis. 2. Unremarkable MRCP with no biliary dilation or choledocholithiasis. Electronically Signed   By: Sharlet Salina M.D.   On: 03/20/2020 20:13   US ABDOMEN LIMITED RUQ (LIVER/GB)  Result Date: 03/20/2020 CLINICAL DATA:  Right upper quadrant abdominal pain and vomiting. EXAM: ULTRASOUND ABDOMEN LIMITED RIGHT UPPER QUADRANT COMPARISON:  CT of the abdomen and pelvis earlier today. FINDINGS: Gallbladder: Numerous echogenic calculi are seen within the gallbladder lumen. The gallbladder is mildly distended. There is some edema/fluid in the gallbladder fossa adjacent to the liver. No significant gallbladder wall thickening. Common bile duct: Diameter: 6 mm Liver: The liver demonstrates coarse echotexture and increased echogenicity, likely reflecting diffuse steatosis. No  overt cirrhotic contour abnormalities or focal lesions are identified. There is no evidence of intrahepatic biliary ductal dilatation. The portal vein is patent on color Doppler imaging with normal direction of blood flow towards the liver. Other: None. IMPRESSION:  Cholelithiasis with numerous gallstones present. There is suggestion of edema in the gallbladder fossa between the gallbladder and liver consistent with acute inflammation. There is no overt gallbladder wall thickening. Electronically Signed   By: Irish LackGlenn  Yamagata M.D.   On: 03/20/2020 14:46     Labs:   Basic Metabolic Panel: Recent Labs  Lab 03/20/20 1019 03/21/20 0450 03/22/20 0415 03/23/20 0418  NA 131* 134* 131* 133*  K 3.7 3.7 3.8 3.6  CL 99 103 99 102  CO2 21* 21* 24 22  GLUCOSE 278* 163* 168* 148*  BUN 18 15 13 14   CREATININE 0.79 0.64 0.70 0.63  CALCIUM 9.2 8.5* 8.3* 8.2*   GFR Estimated Creatinine Clearance: 124 mL/min (by C-G formula based on SCr of 0.63 mg/dL). Liver Function Tests: Recent Labs  Lab 03/20/20 1019 03/21/20 0450 03/22/20 0415 03/23/20 0418  AST 104* 82* 50* 41  ALT 248* 200* 143* 107*  ALKPHOS 231* 210* 197* 170*  BILITOT 2.2* 2.5* 2.7* 1.7*  PROT 9.1* 8.1 7.4 7.0  ALBUMIN 4.3 3.6 3.1* 2.7*   Recent Labs  Lab 03/20/20 1019 03/21/20 0450  LIPASE 246* 112*   No results for input(s): AMMONIA in the last 168 hours. Coagulation profile No results for input(s): INR, PROTIME in the last 168 hours.  CBC: Recent Labs  Lab 03/20/20 1019 03/21/20 0450 03/22/20 0415 03/23/20 0418  WBC 8.7 14.4* 15.5* 11.4*  NEUTROABS  --   --   --  8.2*  HGB 15.3 14.1 13.5 11.7*  HCT 43.8 42.5 38.7* 34.8*  MCV 86.9 89.5 86.6 89.2  PLT 244 260 269 312   Cardiac Enzymes: No results for input(s): CKTOTAL, CKMB, CKMBINDEX, TROPONINI in the last 168 hours. BNP: Invalid input(s): POCBNP CBG: Recent Labs  Lab 03/23/20 1153 03/23/20 1535 03/23/20 2041 03/24/20 0154 03/24/20 0750  GLUCAP 139* 160* 154* 132* 127*   D-Dimer No results for input(s): DDIMER in the last 72 hours. Hgb A1c No results for input(s): HGBA1C in the last 72 hours. Lipid Profile Recent Labs    03/22/20 0415  CHOL 168  HDL 25*  LDLCALC 114*  TRIG 143  CHOLHDL 6.7    Thyroid function studies No results for input(s): TSH, T4TOTAL, T3FREE, THYROIDAB in the last 72 hours.  Invalid input(s): FREET3 Anemia work up No results for input(s): VITAMINB12, FOLATE, FERRITIN, TIBC, IRON, RETICCTPCT in the last 72 hours. Microbiology Recent Results (from the past 240 hour(s))  Resp Panel by RT-PCR (Flu A&B, Covid) Nasopharyngeal Swab     Status: None   Collection Time: 03/20/20  1:26 PM   Specimen: Nasopharyngeal Swab; Nasopharyngeal(NP) swabs in vial transport medium  Result Value Ref Range Status   SARS Coronavirus 2 by RT PCR NEGATIVE NEGATIVE Final    Comment: (NOTE) SARS-CoV-2 target nucleic acids are NOT DETECTED.  The SARS-CoV-2 RNA is generally detectable in upper respiratory specimens during the acute phase of infection. The lowest concentration of SARS-CoV-2 viral copies this assay can detect is 138 copies/mL. A negative result does not preclude SARS-Cov-2 infection and should not be used as the sole basis for treatment or other patient management decisions. A negative result may occur with  improper specimen collection/handling, submission of specimen other than nasopharyngeal swab, presence of viral mutation(s) within the  areas targeted by this assay, and inadequate number of viral copies(<138 copies/mL). A negative result must be combined with clinical observations, patient history, and epidemiological information. The expected result is Negative.  Fact Sheet for Patients:  BloggerCourse.com  Fact Sheet for Healthcare Providers:  SeriousBroker.it  This test is no t yet approved or cleared by the [place] FDA and  has been authorized for detection and/or diagnosis of SARS-CoV-2 by FDA under an Emergency Use Authorization (EUA). This EUA will remain  in effect (meaning this test can be used) for the duration of the COVID-19 declaration under Section 564(b)(1) of the Act,  21 U.S.C.section 360bbb-3(b)(1), unless the authorization is terminated  or revoked sooner.       Influenza A by PCR NEGATIVE NEGATIVE Final   Influenza B by PCR NEGATIVE NEGATIVE Final    Comment: (NOTE) The Xpert Xpress SARS-CoV-2/FLU/RSV plus assay is intended as an aid in the diagnosis of influenza from Nasopharyngeal swab specimens and should not be used as a sole basis for treatment. Nasal washings and aspirates are unacceptable for Xpert Xpress SARS-CoV-2/FLU/RSV testing.  Fact Sheet for Patients: BloggerCourse.com  Fact Sheet for Healthcare Providers: SeriousBroker.it  This test is not yet approved or cleared by the [place] FDA and has been authorized for detection and/or diagnosis of SARS-CoV-2 by FDA under an Emergency Use Authorization (EUA). This EUA will remain in effect (meaning this test can be used) for the duration of the COVID-19 declaration under Section 564(b)(1) of the Act, 21 U.S.C. section 360bbb-3(b)(1), unless the authorization is terminated or revoked.  Performed at [facility], [address] [place], [place] [address]      Discharge Instructions:   Discharge Instructions    Diet - low sodium heart healthy   Complete by: As directed    Diet Carb Modified   Complete by: As directed    Discharge patient   Complete by: As directed    Discharge disposition: 01-Home or Self Care   Discharge patient date: 03/23/2020   Discharge wound care:   Complete by: As directed    Keep incisional wound clean and dry   Increase activity slowly   Complete by: As directed      Allergies as of 03/24/2020   No Known Allergies     Medication List    TAKE these medications   acetaminophen 325 MG tablet Commonly known as: Tylenol Take 2 tablets (650 mg total) by mouth every 8 (eight) hours as needed for mild pain.   docusate sodium 100 MG capsule Commonly known as: Colace Take 1 capsule  (100 mg total) by mouth 2 (two) times daily as needed for up to 10 days for mild constipation.   HYDROcodone-acetaminophen 5-325 MG tablet Commonly known as: Norco Take 1 tablet by mouth every 6 (six) hours as needed for up to 6 doses for moderate pain.   ibuprofen 800 MG tablet Commonly known as: ADVIL Take 1 tablet (800 mg total) by mouth every 8 (eight) hours as needed for mild pain or moderate pain.   metFORMIN 500 MG tablet Commonly known as: Glucophage Take 1 tablet (500 mg total) by mouth 2 (two) times daily with a meal.            Discharge Care Instructions  (From admission, onward)         Start     Ordered   03/24/20 0000  Discharge wound care:       Comments: Keep incisional wound clean and  dry   03/24/20 0858          Follow-up Information    Campbell Lerner, MD. Schedule an appointment as soon as possible for a visit in 1 week.   Specialty: General Surgery Why: post op lap chole    Patient to make own follow up appt Contact information: [address] [place] [address] [phone]                Time coordinating discharge: 31 minutes  Signed:  Lurene Shadow  [facility] 03/24/2020, 8:58 AM   Pager on [email or web address]. If 7PM-7AM, please contact night-coverage at [email or web address]

## 2020-03-24 NOTE — Progress Notes (Signed)
Subjective:  CC: Matthew Stevenson is a 40 y.o. male  Hospital stay day 4, 2 Days Post-Op lap chole  HPI: Report of saturated dressing from one port site. Sanguinous  ROS:  General: Denies weight loss, weight gain, fatigue, fevers, chills, and night sweats. Heart: Denies chest pain, palpitations, racing heart, irregular heartbeat, leg pain or swelling, and decreased activity tolerance. Respiratory: Denies breathing difficulty, shortness of breath, wheezing, cough, and sputum. GI: Denies change in appetite, heartburn, nausea, vomiting, constipation, diarrhea, and blood in stool. GU: Denies difficulty urinating, pain with urinating, urgency, frequency, blood in urine.   Objective:   Temp:  [97.8 F (36.6 C)-98.6 F (37 C)] 98.2 F (36.8 C) (03/06 0752) Pulse Rate:  [62-83] 63 (03/06 0752) Resp:  [15-18] 18 (03/06 0752) BP: (108-120)/(61-77) 120/77 (03/06 0752) SpO2:  [95 %-97 %] 96 % (03/06 0752)     Height: 5\' 5"  (165.1 cm) Weight: 86.2 kg BMI (Calculated): 31.62   Intake/Output this shift:   Intake/Output Summary (Last 24 hours) at 03/24/2020 05/24/2020 Last data filed at 03/23/2020 2332 Gross per 24 hour  Intake 2414.65 ml  Output 1625 ml  Net 789.65 ml    Constitutional :  alert, cooperative, appears stated age and no distress  Respiratory:  clear to auscultation bilaterally  Cardiovascular:  regular rate and rhythm  Gastrointestinal: soft, non-tender; bowel sounds normal; no masses,  no organomegaly. JP with serosanguinsous drainage  Skin: Cool and moist. Incisions c/d/i except for slow oozing out of left lateral port site.  Psychiatric: Normal affect, non-agitated, not confused       LABS:  CMP Latest Ref Rng & Units 03/23/2020 03/22/2020 03/21/2020  Glucose 70 - 99 mg/dL 05/21/2020) 627(O) 350(K)  BUN 6 - 20 mg/dL 14 13 15   Creatinine 0.61 - 1.24 mg/dL 938(H 8.29  Sodium 135 - 145 mmol/L 133(L) 131(L) 134(L)  Potassium 3.5 - 5.1 mmol/L 3.6 3.8 3.7  Chloride 98 - 111 mmol/L  102 99 103  CO2 22 - 32 mmol/L 22 24 21(L)  Calcium 8.9 - 10.3 mg/dL 8.2(L) 8.3(L) 8.5(L)  Total Protein 6.5 - 8.1 g/dL 7.0 7.4 8.1  Total Bilirubin 0.3 - 1.2 mg/dL 9.37) 2.7(H) 2.5(H)  Alkaline Phos 38 - 126 U/L 170(H) 197(H) 210(H)  AST 15 - 41 U/L 41 50(H) 82(H)  ALT 0 - 44 U/L 107(H) 143(H) 200(H)   CBC Latest Ref Rng & Units 03/23/2020 03/22/2020 03/21/2020  WBC 4.0 - 10.5 K/uL 11.4(H) 15.5(H) 14.4(H)  Hemoglobin 13.0 - 17.0 g/dL 11.7(L) 13.5 14.1  Hematocrit 39.0 - 52.0 % 34.8(L) 38.7(L) 42.5  Platelets 150 - 400 K/uL 312 269 260    RADS: n/a Assessment:   S/p lap chole.  Pressure dressing applied to the oozing port site and adequate hemostasis noted after couple hours.   All else is well.  Still Ok to d/c from surgery standpoint.

## 2020-03-24 NOTE — Progress Notes (Signed)
Discussed discharge instructions with pt, discharge summary given to pt. Pt verbalized understanding, IV removed, clean, dry and intact. Awaiting Dr. Tonna Boehringer to come see pt, before discharge.

## 2020-03-25 LAB — SURGICAL PATHOLOGY

## 2020-04-04 ENCOUNTER — Ambulatory Visit (INDEPENDENT_AMBULATORY_CARE_PROVIDER_SITE_OTHER): Payer: Self-pay | Admitting: Surgery

## 2020-04-04 ENCOUNTER — Encounter: Payer: Self-pay | Admitting: Surgery

## 2020-04-04 ENCOUNTER — Other Ambulatory Visit: Payer: Self-pay

## 2020-04-04 VITALS — BP 126/87 | HR 79 | Temp 97.9°F | Ht 65.0 in | Wt 179.0 lb

## 2020-04-04 DIAGNOSIS — Z9049 Acquired absence of other specified parts of digestive tract: Secondary | ICD-10-CM

## 2020-04-04 NOTE — Patient Instructions (Addendum)
puede mantener un vendaje sobre el rea hasta que deje de Sales executive. cambiar [employer]. puede volver a Development worker, international aid, pero no levantar ms de 30 libras durante 2 semanas ms.  GENERAL POST-OPERATIVE PATIENT INSTRUCTIONS   WOUND CARE INSTRUCTIONS:  Keep a dry clean dressing on the wound if there is drainage. The initial bandage may be removed after 24 hours.  Once the wound has quit draining you may leave it open to air.  If clothing rubs against the wound or causes irritation and the wound is not draining you may cover it with a dry dressing during the daytime.  Try to keep the wound dry and avoid ointments on the wound unless directed to do so.  If the wound becomes bright red and painful or starts to drain infected material that is not clear, please contact your physician immediately.  If the wound is mildly pink and has a thick firm ridge underneath it, this is normal, and is referred to as a healing ridge.  This will resolve over the next 4-6 weeks.  BATHING: You may shower if you have been informed of this by your surgeon. However, Please do not submerge in a tub, hot tub, or pool until incisions are completely sealed or have been told by your surgeon that you may do so.  DIET:  You may eat any foods that you can tolerate.  It is a good idea to eat a high fiber diet and take in plenty of fluids to prevent constipation.  If you do become constipated you may want to take a mild laxative or take ducolax tablets on a daily basis until your bowel habits are regular.  Constipation can be very uncomfortable, along with straining, after recent surgery.  ACTIVITY:  You are encouraged to cough and deep breath or use your incentive spirometer if you were given one, every 15-30 minutes when awake.  This will help prevent respiratory complications and low grade fevers post-operatively if you had a general anesthetic.  You may want to hug a pillow when coughing and sneezing to add additional support to  the surgical area, if you had abdominal or chest surgery, which will decrease pain during these times.  You are encouraged to walk and engage in light activity for the next two weeks.  You should not lift more than 20 pounds for 4-6 weeks after surgery as it could put you at increased risk for complications.  Twenty pounds is roughly equivalent to a plastic bag of groceries. At that time- Listen to your body when lifting, if you have pain when lifting, stop and then try again in a few days. Soreness after doing exercises or activities of daily living is normal as you get back in to your normal routine.  MEDICATIONS:  Try to take narcotic medications and anti-inflammatory medications, such as tylenol, ibuprofen, naprosyn, etc., with food.  This will minimize stomach upset from the medication.  Should you develop nausea and vomiting from the pain medication, or develop a rash, please discontinue the medication and contact your physician.  You should not drive, make important decisions, or operate machinery when taking narcotic pain medication.  SUNBLOCK Use sun block to incision area over the next year if this area will be exposed to sun. This helps decrease scarring and will allow you avoid a permanent darkened area over your incision.  QUESTIONS:  Please feel free to call our office if you have any questions, and we will be glad to assist  you.

## 2020-04-04 NOTE — Progress Notes (Signed)
[facility] POST-OP OFFICE VISIT  04/04/2020  HPI: Matthew Stevenson is a 40 y.o. male 13 days s/p robotic cholecystectomy for acute cholecystitis.  He reports diminishment in the drain output, denying it ever being bilious.  He denies fevers and chills, reports regular bowel movements.  He denies nausea or vomiting.  He would like to get back to work.  Vital signs: BP 126/87   Pulse 79   Temp 97.9 F (36.6 C)   Ht 5\' 5"  (1.651 m)   Wt 179 lb (81.2 kg)   SpO2 97%   BMI 29.79 kg/m    Physical Exam: Constitutional: He appears well. Abdomen: Soft benign.  Drain in right upper quadrant.  I milked the drain confirmed that there is no additional fluid for drainage, and the drain was removed.  Dressing applied. Skin: The other incisions are clean, dry and intact.  Assessment/Plan: This is a 40 y.o. male 13 days s/p robotic cholecystectomy for acute cholecystitis.  Patient Active Problem List   Diagnosis Date Noted  . Type 2 diabetes mellitus with complication, without long-term current use of insulin (HCC) 03/21/2020  . Acute gallstone pancreatitis 03/20/2020  . Hyperglycemia 03/20/2020  . Acute cholecystitis 03/20/2020  . Transaminitis 03/20/2020  . Hyponatremia 03/20/2020    -May resume full activity in 2 weeks, he may return to work tomorrow as long as he continues light duty for 2 weeks.  We remain open for follow-up with any concerns regarding his drain site healing.  This is all reviewed with our Spanish interpreter present.  All questions were answered to the patient's satisfaction.   05/20/2020 M.D., FACS 04/04/2020, 9:14 AM

## 2020-04-05 ENCOUNTER — Emergency Department: Payer: Self-pay

## 2020-04-05 ENCOUNTER — Other Ambulatory Visit: Payer: Self-pay

## 2020-04-05 ENCOUNTER — Inpatient Hospital Stay
Admission: EM | Admit: 2020-04-05 | Discharge: 2020-04-10 | DRG: 440 | Disposition: A | Discharge status: Home / Self Care | Payer: Self-pay | Attending: Internal Medicine | Admitting: Internal Medicine

## 2020-04-05 DIAGNOSIS — E119 Type 2 diabetes mellitus without complications: Secondary | ICD-10-CM

## 2020-04-05 DIAGNOSIS — Z23 Encounter for immunization: Secondary | ICD-10-CM

## 2020-04-05 DIAGNOSIS — E1165 Type 2 diabetes mellitus with hyperglycemia: Secondary | ICD-10-CM | POA: Diagnosis present

## 2020-04-05 DIAGNOSIS — K219 Gastro-esophageal reflux disease without esophagitis: Secondary | ICD-10-CM | POA: Diagnosis present

## 2020-04-05 DIAGNOSIS — K858 Other acute pancreatitis without necrosis or infection: Principal | ICD-10-CM | POA: Diagnosis present

## 2020-04-05 DIAGNOSIS — Z9049 Acquired absence of other specified parts of digestive tract: Secondary | ICD-10-CM

## 2020-04-05 DIAGNOSIS — R17 Unspecified jaundice: Secondary | ICD-10-CM

## 2020-04-05 DIAGNOSIS — Z87891 Personal history of nicotine dependence: Secondary | ICD-10-CM

## 2020-04-05 DIAGNOSIS — K859 Acute pancreatitis without necrosis or infection, unspecified: Secondary | ICD-10-CM | POA: Diagnosis present

## 2020-04-05 DIAGNOSIS — Z7984 Long term (current) use of oral hypoglycemic drugs: Secondary | ICD-10-CM

## 2020-04-05 DIAGNOSIS — Z20822 Contact with and (suspected) exposure to covid-19: Secondary | ICD-10-CM | POA: Diagnosis present

## 2020-04-05 DIAGNOSIS — R7401 Elevation of levels of liver transaminase levels: Secondary | ICD-10-CM | POA: Diagnosis present

## 2020-04-05 LAB — URINALYSIS, COMPLETE (UACMP) WITH MICROSCOPIC
Bacteria, UA: NONE SEEN
Bilirubin Urine: NEGATIVE
Glucose, UA: >=500 mg/dL — AB
Hgb urine dipstick: NEGATIVE
Ketones, ur: 5 mg/dL — AB
Leukocytes,Ua: NEGATIVE
Nitrite: NEGATIVE
Protein, ur: NEGATIVE mg/dL
Specific Gravity, Urine: 1.041 — ABNORMAL HIGH (ref 1.005–1.030)
pH: 7 (ref 5.0–8.0)

## 2020-04-05 LAB — COMPREHENSIVE METABOLIC PANEL
ALT: 937 U/L — ABNORMAL HIGH (ref 0–44)
AST: 984 U/L — ABNORMAL HIGH (ref 15–41)
Albumin: 4.3 g/dL (ref 3.5–5.0)
Alkaline Phosphatase: 300 U/L — ABNORMAL HIGH (ref 38–126)
Anion gap: 10 (ref 5–15)
BUN: 16 mg/dL (ref 6–20)
CO2: 26 mmol/L (ref 22–32)
Calcium: 9.6 mg/dL (ref 8.9–10.3)
Chloride: 100 mmol/L (ref 98–111)
Creatinine, Ser: 0.8 mg/dL (ref 0.61–1.24)
GFR, Estimated: >60 mL/min (ref >60)
Glucose, Bld: 223 mg/dL — ABNORMAL HIGH (ref 70–99)
Potassium: 4 mmol/L (ref 3.5–5.1)
Sodium: 136 mmol/L (ref 135–145)
Total Bilirubin: 2.1 mg/dL — ABNORMAL HIGH (ref 0.3–1.2)
Total Protein: 8.7 g/dL — ABNORMAL HIGH (ref 6.5–8.1)

## 2020-04-05 LAB — RESP PANEL BY RT-PCR (FLU A&B, COVID) ARPGX2
Influenza A by PCR: NEGATIVE
Influenza B by PCR: NEGATIVE
SARS Coronavirus 2 by RT PCR: NEGATIVE

## 2020-04-05 LAB — CBG MONITORING, ED
Glucose-Capillary: 211 mg/dL — ABNORMAL HIGH (ref 70–99)
Glucose-Capillary: 140 mg/dL — ABNORMAL HIGH (ref 70–99)
Glucose-Capillary: 86 mg/dL (ref 70–99)

## 2020-04-05 LAB — GLUCOSE, CAPILLARY: Glucose-Capillary: 100 mg/dL — ABNORMAL HIGH (ref 70–99)

## 2020-04-05 LAB — CBC
HCT: 43.4 % (ref 39.0–52.0)
Hemoglobin: 14.5 g/dL (ref 13.0–17.0)
MCH: 29.9 pg (ref 26.0–34.0)
MCHC: 33.4 g/dL (ref 30.0–36.0)
MCV: 89.5 fL (ref 80.0–100.0)
Platelets: 375 10*3/uL (ref 150–400)
RBC: 4.85 MIL/uL (ref 4.22–5.81)
RDW: 12.6 % (ref 11.5–15.5)
WBC: 13.4 10*3/uL — ABNORMAL HIGH (ref 4.0–10.5)
nRBC: 0 % (ref 0.0–0.2)

## 2020-04-05 LAB — LACTIC ACID, PLASMA
Lactic Acid, Venous: 2.3 mmol/L (ref 0.5–1.9)
Lactic Acid, Venous: 2 mmol/L (ref 0.5–1.9)

## 2020-04-05 LAB — PROCALCITONIN: Procalcitonin: 0.14 ng/mL

## 2020-04-05 LAB — TROPONIN I (HIGH SENSITIVITY)
Troponin I (High Sensitivity): 4 ng/L (ref <18)
Troponin I (High Sensitivity): 8 ng/L (ref <18)

## 2020-04-05 LAB — LIPASE, BLOOD: Lipase: 6933 U/L — ABNORMAL HIGH (ref 11–51)

## 2020-04-05 LAB — BILIRUBIN, DIRECT: Bilirubin, Direct: 1.5 mg/dL — ABNORMAL HIGH (ref 0.0–0.2)

## 2020-04-05 MED ORDER — ONDANSETRON HCL 4 MG/2ML IJ SOLN
INTRAMUSCULAR | Status: AC
  Administered 2020-04-05: 4 mg
  Filled 2020-04-05: qty 2

## 2020-04-05 MED ORDER — PIPERACILLIN-TAZOBACTAM 3.375 G IVPB
3.3750 g | Freq: Three times a day (TID) | INTRAVENOUS | Status: DC
  Administered 2020-04-05 – 2020-04-09 (×12): 3.375 g via INTRAVENOUS
  Filled 2020-04-05 (×12): qty 50

## 2020-04-05 MED ORDER — METRONIDAZOLE IN NACL 5-0.79 MG/ML-% IV SOLN
500.0000 mg | Freq: Three times a day (TID) | INTRAVENOUS | Status: DC
  Administered 2020-04-05 – 2020-04-06 (×3): 500 mg via INTRAVENOUS
  Filled 2020-04-05 (×5): qty 100

## 2020-04-05 MED ORDER — LACTATED RINGERS IV BOLUS
1000.0000 mL | Freq: Once | INTRAVENOUS | Status: AC
  Administered 2020-04-05: 1000 mL via INTRAVENOUS

## 2020-04-05 MED ORDER — MORPHINE SULFATE (PF) 2 MG/ML IV SOLN
2.0000 mg | INTRAVENOUS | Status: DC | PRN
Start: 2020-04-05 — End: 2020-04-06
  Administered 2020-04-05 – 2020-04-06 (×3): 2 mg via INTRAVENOUS
  Filled 2020-04-05 (×3): qty 1

## 2020-04-05 MED ORDER — PIPERACILLIN-TAZOBACTAM 3.375 G IVPB 30 MIN
3.3750 g | Freq: Once | INTRAVENOUS | Status: AC
  Administered 2020-04-05: 3.375 g via INTRAVENOUS
  Filled 2020-04-05: qty 50

## 2020-04-05 MED ORDER — IOHEXOL 300 MG/ML  SOLN
100.0000 mL | Freq: Once | INTRAMUSCULAR | Status: AC | PRN
  Administered 2020-04-05: 100 mL via INTRAVENOUS

## 2020-04-05 MED ORDER — ONDANSETRON HCL 4 MG/2ML IJ SOLN
4.0000 mg | Freq: Four times a day (QID) | INTRAMUSCULAR | Status: DC | PRN
  Administered 2020-04-05: 4 mg via INTRAVENOUS
  Filled 2020-04-05: qty 2

## 2020-04-05 MED ORDER — ENOXAPARIN SODIUM 40 MG/0.4ML ~~LOC~~ SOLN
40.0000 mg | SUBCUTANEOUS | Status: DC
  Administered 2020-04-05 – 2020-04-09 (×5): 40 mg via SUBCUTANEOUS
  Filled 2020-04-05 (×5): qty 0.4

## 2020-04-05 MED ORDER — ONDANSETRON HCL 4 MG PO TABS: 4.0000 mg | ORAL_TABLET | Freq: Four times a day (QID) | ORAL | Status: DC | PRN

## 2020-04-05 MED ORDER — MORPHINE SULFATE (PF) 4 MG/ML IV SOLN
4.0000 mg | Freq: Once | INTRAVENOUS | Status: AC
Start: 2020-04-05 — End: 2020-04-05
  Administered 2020-04-05: 4 mg via INTRAVENOUS
  Filled 2020-04-05: qty 1

## 2020-04-05 MED ORDER — SODIUM CHLORIDE 0.9 % IV SOLN: INTRAVENOUS | Status: DC

## 2020-04-05 MED ORDER — INSULIN ASPART 100 UNIT/ML ~~LOC~~ SOLN: 0.0000 [IU] | SUBCUTANEOUS | Status: DC

## 2020-04-05 MED ORDER — INSULIN ASPART 100 UNIT/ML ~~LOC~~ SOLN
0.0000 [IU] | SUBCUTANEOUS | Status: DC
  Administered 2020-04-05: 5 [IU] via SUBCUTANEOUS
  Filled 2020-04-05: qty 1

## 2020-04-05 MED ORDER — INSULIN ASPART 100 UNIT/ML ~~LOC~~ SOLN
0.0000 [IU] | SUBCUTANEOUS | Status: DC
  Administered 2020-04-05: 2 [IU] via SUBCUTANEOUS
  Filled 2020-04-05: qty 1

## 2020-04-05 MED ORDER — GADOBUTROL 1 MMOL/ML IV SOLN
8.0000 mL | Freq: Once | INTRAVENOUS | Status: AC | PRN
  Administered 2020-04-05: 8 mL via INTRAVENOUS

## 2020-04-05 MED ORDER — MORPHINE SULFATE (PF) 4 MG/ML IV SOLN
4.0000 mg | Freq: Once | INTRAVENOUS | Status: AC
  Administered 2020-04-05: 4 mg via INTRAVENOUS
  Filled 2020-04-05: qty 1

## 2020-04-05 NOTE — H&P (Addendum)
History and Physical    Matthew RatelMiguel Zarate Stevenson WUJ:[id number] DOB: May 06, 1980 DOA: 04/05/2020  PCP: Patient, No Pcp Per   Patient coming from: Home  I have personally briefly reviewed patient's old medical records in [facility]  Chief Complaint: Abdominal pain  HPI: Matthew Stevenson is a 40 y.o. male with medical history significant for recent laparoscopic cholecystectomy for acute gallstone pancreatitis which was done 2 weeks ago, status post drain placement and removal on 04/04/20 and diabetes mellitus who presents to the emergency room for evaluation of sudden onset abdominal pain which was mostly in the epigastrium and peri umbilical area. He rated his pain a 10/10 in intensity at its worst, and pain was associated with nausea and multiple episodes of vomiting.  He has no known relieving or aggravating factors.  Patient states that this pain is more severe than his last episode.  He denies any alcohol ingestion and has been able to eat small frequent meals.  Patient states he has had fever at home even though it was undocumented. He denies having any changes in his bowel habits, denies having any constipation, no diarrhea, no chills, no nocturia, no dysuria, no dizziness, no lightheadedness, no palpitations, no diaphoresis, no chest pain, no cough, no shortness of breath, no blurred vision no focal deficits. Labs show sodium 136, potassium 4.0, chloride 100, bicarb 26, glucose 223, BUN 16, creatinine 0.80, calcium 9.6, alkaline phosphatase 300, albumin 4.3, lipase 6000 933, AST 984, ALT 937, total protein 8.7, direct bilirubin 1.5, lactic acid 2.3 >> 2.0, procalcitonin 0.14, white count 13.4, hemoglobin 14.5, hematocrit 43.4, MCV 89.5, RDW 12.6, platelet count 375 CT scan of abdomen and pelvis shows status post cholecystectomy with evidence of Acute Pancreatitis. Inflammation in the lesser sac contiguous with the gallbladder fossa Secondary inflammation of the duodenum and root of  the small bowel mesentery. Small new 2.4 cm area of heterogeneous liver parenchyma adjacent to the gallbladder fossa is likely inflammatory in this setting and suspicious for a developing small liver abscess  But no drainable fluid collection at this time. Trace free fluid in the pelvis. Hyperenhancement of the residual cystic duct. Increased CBD size, although favor postoperative in nature given absence of intrahepatic ductal dilatation. Postoperative changes to the ventral abdomen including granulation tissue along a drainage tract to the gallbladder fossa. Partially visible right middle lobe pulmonary ground-glass opacity is stable since March and favor scarring over acute pulmonary abnormality. MRCP shows findings consistent with acute interstitial pancreatitis. No findings for pancreatic necrosis. Mild diffuse inflammation and enhancement of the common bile duct likely due to the surrounding pancreatitis. Cholangitis would be another possibility. No obstructing common bile duct stones are identified. Status post cholecystectomy. No complicating features such as bile leak or abscess. Small not unexpected postop fluid collection in the gallbladder fossa. Mild diffuse inflammation of the colon possibly inflammatory or infectious colitis. Twelve-lead EKG shows normal sinus rhythm   ED Course: Patient is a 40 year old Hispanic male who is status post recent laparoscopic cholecystectomy for acute gallstone pancreatitis returns to the emergency room for evaluation of abdominal pain.  Patient is noted to have acute pancreatitis with concerns for possible cholangitis and liver abscess found on imaging studies.  He is afebrile and has no jaundice. Patient received a dose of Zosyn in the ER and will be admitted to the hospital for further evaluation.   Review of Systems: As per HPI otherwise all other systems reviewed and negative.    Past Medical History:  Diagnosis  Date  . Acute gallstone  pancreatitis 03/20/2020  . Diabetes mellitus without complication [facility])     Past Surgical History:  Procedure Laterality Date  . CHOLECYSTECTOMY  03/22/2020     reports that he has never smoked. He has never used smokeless tobacco. He reports current alcohol use. He reports that he does not use drugs.  No Known Allergies  Family History  Family history unknown: Yes      Prior to Admission medications   Medication Sig Start Date End Date Taking? Authorizing Provider  ibuprofen (ADVIL) 800 MG tablet Take 1 tablet (800 mg total) by mouth every 8 (eight) hours as needed for mild pain or moderate pain. 03/23/20  Yes Sakai, Isami, DO  metFORMIN (GLUCOPHAGE) 500 MG tablet Take 1 tablet (500 mg total) by mouth 2 (two) times daily with a meal. 03/24/20 03/24/21 Yes Lurene Shadow, MD  acetaminophen (TYLENOL) 325 MG tablet Take 2 tablets (650 mg total) by mouth every 8 (eight) hours as needed for mild pain. Patient not taking: Reported on 04/05/2020 03/23/20 04/22/20  Sung Amabile, DO    Physical Exam: Vitals:   04/05/20 0506 04/05/20 0603 04/05/20 0800 04/05/20 0900  BP: (!) 171/63 137/82 131/85 121/82  Pulse: 64 69 64 65  Resp: 20 18 20  (!) 21  Temp:      TempSrc:      SpO2: 99% 96% 98% 96%  Weight:      Height:         Vitals:   04/05/20 0506 04/05/20 0603 04/05/20 0800 04/05/20 0900  BP: (!) 171/63 137/82 131/85 121/82  Pulse: 64 69 64 65  Resp: 20 18 20  (!) 21  Temp:      TempSrc:      SpO2: 99% 96% 98% 96%  Weight:      Height:          Constitutional: Alert and oriented x 3 .  Acutely ill-appearing HEENT:      Head: Normocephalic and atraumatic.         Eyes: PERLA, EOMI, Conjunctivae pallor. Sclera is non-icteric.       Mouth/Throat: Mucous membranes are moist.       Neck: Supple with no signs of meningismus. Cardiovascular: Regular rate and rhythm. No murmurs, gallops, or rubs. 2+ symmetrical distal pulses are present . No JVD. No LE edema Respiratory: Respiratory effort  normal .Lungs sounds clear bilaterally. No wheezes, crackles, or rhonchi.  Gastrointestinal: Soft, tenderness in the epigastrium and periumbilical area, and non distended with positive bowel sounds.  Genitourinary: No CVA tenderness. Musculoskeletal: Nontender with normal range of motion in all extremities. No cyanosis, or erythema of extremities. Neurologic:  Face is symmetric. Moving all extremities. No gross focal neurologic deficits . Skin: Skin is warm, dry.  No rash or ulcers Psychiatric: Mood and affect are normal   Labs on Admission: I have personally reviewed following labs and imaging studies  CBC: Recent Labs  Lab 04/05/20 0125  WBC 13.4*  HGB 14.5  HCT 43.4  MCV 89.5  PLT 375   Basic Metabolic Panel: Recent Labs  Lab 04/05/20 0125  NA 136  K 4.0  CL 100  CO2 26  GLUCOSE 223*  BUN 16  CREATININE 0.80  CALCIUM 9.6   GFR: Estimated Creatinine Clearance: 119.3 mL/min (by C-G formula based on SCr of 0.8 mg/dL). Liver Function Tests: Recent Labs  Lab 04/05/20 0125  AST 984*  ALT 937*  ALKPHOS 300*  BILITOT 2.1*  PROT 8.7*  ALBUMIN 4.3   Recent Labs  Lab 04/05/20 0125  LIPASE 6,933*   No results for input(s): AMMONIA in the last 168 hours. Coagulation Profile: No results for input(s): INR, PROTIME in the last 168 hours. Cardiac Enzymes: No results for input(s): CKTOTAL, CKMB, CKMBINDEX, TROPONINI in the last 168 hours. BNP (last 3 results) No results for input(s): PROBNP in the last 8760 hours. HbA1C: No results for input(s): HGBA1C in the last 72 hours. CBG: Recent Labs  Lab 04/05/20 0801  GLUCAP 211*   Lipid Profile: No results for input(s): CHOL, HDL, LDLCALC, TRIG, CHOLHDL, LDLDIRECT in the last 72 hours. Thyroid Function Tests: No results for input(s): TSH, T4TOTAL, FREET4, T3FREE, THYROIDAB in the last 72 hours. Anemia Panel: No results for input(s): VITAMINB12, FOLATE, FERRITIN, TIBC, IRON, RETICCTPCT in the last 72 hours. Urine  analysis:    Component Value Date/Time   COLORURINE YELLOW (A) 04/05/2020 0610   APPEARANCEUR CLEAR (A) 04/05/2020 0610   LABSPEC 1.041 (H) 04/05/2020 0610   PHURINE 7.0 04/05/2020 0610   GLUCOSEU >=500 (A) 04/05/2020 0610   HGBUR NEGATIVE 04/05/2020 0610   BILIRUBINUR NEGATIVE 04/05/2020 0610   KETONESUR 5 (A) 04/05/2020 0610   PROTEINUR NEGATIVE 04/05/2020 0610   NITRITE NEGATIVE 04/05/2020 0610   LEUKOCYTESUR NEGATIVE 04/05/2020 0610    Radiological Exams on Admission: CT ABDOMEN PELVIS W CONTRAST  Result Date: 04/05/2020 CLINICAL DATA:  40 year old male with abdominal pain, nausea vomiting. Laparoscopic cholecystectomy 2 weeks ago with postoperative drain removed yesterday. EXAM: CT ABDOMEN AND PELVIS WITH CONTRAST TECHNIQUE: Multidetector CT imaging of the abdomen and pelvis was performed using the standard protocol following bolus administration of intravenous contrast. CONTRAST:  OMNIPAQUE IOHEXOL 300 MG/ML  SOLN COMPARISON:  CT Abdomen and Pelvis 03/20/2020, MRI and MRCP that day. FINDINGS: Lower chest: Partially visible right middle lobe ground-glass opacity (series 4, image 3 today) is unchanged. Therefore favor scarring over acute pulmonary abnormality. Stable cardiac size at the upper limits of normal. No pericardial or pleural effusion. Visible lower lobes are clear. Hepatobiliary: Absent gallbladder now with mild hyperenhancement of the residual cystic duct (coronal image 41). Small 17 x 17 x 24 mm (AP by transverse by CC) area of heterogeneous hypodensity of the liver parenchyma at the gallbladder fossa (coronal image 36). CBD measures 8-9 mm diameter, but there is no intrahepatic biliary ductal dilatation. No filling defect identified within the CBD which seems to taper to the duodenum. No drainable fluid. Pancreas: New peripancreatic inflammatory stranding in the lesser sac centered at the pancreatic body. No pancreatic ductal dilatation or necrosis. Spleen: Negative.  Adrenals/Urinary Tract: Stable and negative. Symmetric renal enhancement and contrast excretion with no hydronephrosis or hydroureter. Stomach/Bowel: Negative large bowel and appendix (series 2, image 61). No dilated small bowel. Negative terminal ileum. Inflammation at the root of the small bowel mesentery mostly affecting the duodenum which is decompressed. Lesser involvement of the distal stomach, proximal jejunum. No free air. Postoperative changes to the ventral abdominal wall since the prior CT, including a subtle tract from the ventral abdomen to the gallbladder fossa probably related to the removed drain (coronal image 28). Vascular/Lymphatic: Major arterial structures are patent with no atherosclerosis identified. Portal venous system is patent. Small reactive appearing upper abdominal lymph nodes are not significantly changed. Reproductive: Negative. Other: Trace free fluid in the pelvis suspected on series 2, image 81. Musculoskeletal: Stable.  No acute osseous abnormality identified. IMPRESSION: 1. Status post cholecystectomy with evidence of Acute Pancreatitis. Inflammation in the lesser sac  contiguous with the gallbladder fossa (see #2). Secondary inflammation of the duodenum and root of the small bowel mesentery. 2. Small new 2.4 cm area of heterogeneous liver parenchyma adjacent to the gallbladder fossa is likely inflammatory in this setting and suspicious for a developing small liver abscess (coronal image 36). But no drainable fluid collection at this time. Trace free fluid in the pelvis. 3. Hyperenhancement of the residual cystic duct. Increased CBD size, although favor postoperative in nature given absence of intrahepatic ductal dilatation. 4. Postoperative changes to the ventral abdomen including granulation tissue along a drainage tract to the gallbladder fossa. 5. Partially visible right middle lobe pulmonary ground-glass opacity is stable since March and favor scarring over acute pulmonary  abnormality. Electronically Signed   By: Odessa Fleming M.D.   On: 04/05/2020 04:59   MR 3D Recon At Scanner  Result Date: 04/05/2020 CLINICAL DATA:  Acute pancreatitis. History of recent cholecystectomy. EXAM: MRI ABDOMEN WITH CONTRAST (WITH MRCP) TECHNIQUE: Multiplanar multisequence MR imaging of the abdomen was performed following the administration of intravenous contrast. Heavily T2-weighted images of the biliary and pancreatic ducts were obtained, and three-dimensional MRCP images were rendered by post processing. CONTRAST:  55mL GADAVIST GADOBUTROL 1 MMOL/ML IV SOLN COMPARISON:  CT scan, same date. FINDINGS: Lower chest: The lung bases are clear of an acute process. No pulmonary lesions or pleural effusion. No pericardial effusion. Hepatobiliary: No hepatic lesions or intrahepatic biliary dilatation. The gallbladder is surgically absent. No complicating features such as bile leak or abscess. Small not unexpected postoperative fluid collection in the gallbladder fossa. Normal caliber and course of the common bile duct. There is mild diffuse inflammation of common bile duct likely due to the surrounding pancreatitis. Cholangitis would be another possibility. No evidence of enhancement of intrahepatic ducts. No findings for common bile duct stones. Pancreas: MR findings consistent with acute interstitial pancreatitis. Pancreas is mildly enlarged and diffusely inflamed with peripancreatic inflammation and fluid. No findings for pancreatic necrosis. Normal main pancreatic duct. Spleen:  Normal size.  No focal lesions. Adrenals/Urinary Tract: The adrenal glands kidneys are unremarkable. Stomach/Bowel: There appears to be mild diffuse inflammation of the colon possibly inflammatory or infectious colitis. There is also moderate inflammation of the duodenum due to the surrounding pancreatitis. Vascular/Lymphatic: The aorta and branch vessels are normal. The major venous structures are patent. There are small scattered  upper abdominal lymph nodes which are likely reactive. Other: No focal abdominal fluid collections. No abdominal wall hernia. Musculoskeletal: No significant bony findings. IMPRESSION: 1. MR findings consistent with acute interstitial pancreatitis. No findings for pancreatic necrosis. 2. Mild diffuse inflammation and enhancement of the common bile duct likely due to the surrounding pancreatitis. Cholangitis would be another possibility. No obstructing common bile duct stones are identified. 3. Status post cholecystectomy. No complicating features such as bile leak or abscess. Small not unexpected postop fluid collection in the gallbladder fossa. 4. Mild diffuse inflammation of the colon possibly inflammatory or infectious colitis. Electronically Signed   By: Rudie Meyer M.D.   On: 04/05/2020 08:00   MR ABDOMEN MRCP W WO CONTAST  Result Date: 04/05/2020 CLINICAL DATA:  Acute pancreatitis. History of recent cholecystectomy. EXAM: MRI ABDOMEN WITH CONTRAST (WITH MRCP) TECHNIQUE: Multiplanar multisequence MR imaging of the abdomen was performed following the administration of intravenous contrast. Heavily T2-weighted images of the biliary and pancreatic ducts were obtained, and three-dimensional MRCP images were rendered by post processing. CONTRAST:  31mL GADAVIST GADOBUTROL 1 MMOL/ML IV SOLN COMPARISON:  CT scan, same  date. FINDINGS: Lower chest: The lung bases are clear of an acute process. No pulmonary lesions or pleural effusion. No pericardial effusion. Hepatobiliary: No hepatic lesions or intrahepatic biliary dilatation. The gallbladder is surgically absent. No complicating features such as bile leak or abscess. Small not unexpected postoperative fluid collection in the gallbladder fossa. Normal caliber and course of the common bile duct. There is mild diffuse inflammation of common bile duct likely due to the surrounding pancreatitis. Cholangitis would be another possibility. No evidence of enhancement of  intrahepatic ducts. No findings for common bile duct stones. Pancreas: MR findings consistent with acute interstitial pancreatitis. Pancreas is mildly enlarged and diffusely inflamed with peripancreatic inflammation and fluid. No findings for pancreatic necrosis. Normal main pancreatic duct. Spleen:  Normal size.  No focal lesions. Adrenals/Urinary Tract: The adrenal glands kidneys are unremarkable. Stomach/Bowel: There appears to be mild diffuse inflammation of the colon possibly inflammatory or infectious colitis. There is also moderate inflammation of the duodenum due to the surrounding pancreatitis. Vascular/Lymphatic: The aorta and branch vessels are normal. The major venous structures are patent. There are small scattered upper abdominal lymph nodes which are likely reactive. Other: No focal abdominal fluid collections. No abdominal wall hernia. Musculoskeletal: No significant bony findings. IMPRESSION: 1. MR findings consistent with acute interstitial pancreatitis. No findings for pancreatic necrosis. 2. Mild diffuse inflammation and enhancement of the common bile duct likely due to the surrounding pancreatitis. Cholangitis would be another possibility. No obstructing common bile duct stones are identified. 3. Status post cholecystectomy. No complicating features such as bile leak or abscess. Small not unexpected postop fluid collection in the gallbladder fossa. 4. Mild diffuse inflammation of the colon possibly inflammatory or infectious colitis. Electronically Signed   By: Rudie Meyer M.D.   On: 04/05/2020 08:00     Assessment/Plan Principal Problem:   Acute pancreatitis Active Problems:   Transaminitis   Type 2 diabetes mellitus without complication (HCC)     Acute pancreatitis  In a patient who is status post recent laparoscopic cholecystectomy for gallstone pancreatitis He presents for evaluation of abdominal pain and has elevated lipase levels and transaminitis Imaging does not show  evidence of a common bile duct stone but is concerning for possible cholangitis We will place patient on Zosyn adjusted to renal function.  Add Flagyl for possible liver abscess noted on CT scan Keep patient n.p.o. IV fluid hydration, pain control and antiemetics We will request GI consult    Diabetes mellitus We will keep patient n.p.o. Glycemic control with sliding scale insulin Accu-Chek every 4 hours   DVT prophylaxis: Lovenox Code Status: full code Family Communication: Greater than 50% of time was spent discussing patient's condition and plan of care with him at the bedside.  All questions and concerns have been addressed.  He verbalizes understanding and agrees with the plan. Disposition Plan: Back to previous home environment Consults called: Gastroenterology Status: At the time of admission, it appears that the appropriate admission status for this patient is inpatient. This is judged to be reasonable and necessary in order to provide the required intensity of service to ensure the patient's safety given the presenting symptoms, physical exam findings and initial radiographic and laboratory data in the context of their comorbid conditions.    Lucile Shutters MD [facility]     04/05/2020, 10:26 AM

## 2020-04-05 NOTE — Consult Note (Signed)
Pharmacy Antibiotic Note  Matthew Stevenson is a 40 y.o. male admitted on 04/05/2020 with acute pancreatitis.  Pharmacy has been consulted for Zosyn dosing.  Plan: Zosyn 3.375g IV q8h (4 hour infusion).  Height: 5\' 5"  (165.1 cm) Weight: 79.4 kg (175 lb) IBW/kg (Calculated) : 61.5  Temp (24hrs), Avg:97.8 F (36.6 C), Min:97.5 F (36.4 C), Max:98 F (36.7 C)  Recent Labs  Lab 04/05/20 0125 04/05/20 0400 04/05/20 0610  WBC 13.4*  --   --   CREATININE 0.80  --   --   LATICACIDVEN  --  2.3* 2.0*    Estimated Creatinine Clearance: 119.3 mL/min (by C-G formula based on SCr of 0.8 mg/dL).    No Known Allergies  Antimicrobials this admission: Zosyn 3/18 >>   Microbiology results: 3/18 BCx: pending   Thank you for allowing pharmacy to be a part of this patient's care.  Ravin Denardo A Isatu Macinnes 04/05/2020 11:49 AM

## 2020-04-05 NOTE — ED Triage Notes (Signed)
Pt states coming in due to abdominal pain that started at 6pm. Pt states he is having vomiting, but no diarrhea. Pt states pain is sharp in nature.     [facility] interpreter julio: [id number]

## 2020-04-05 NOTE — ED Notes (Signed)
Pt back from MRI. Pt given urinal at this time

## 2020-04-05 NOTE — ED Provider Notes (Signed)
I assumed care of this patient approximately 0 700.  Please see outgoing providers note for full details regarding patient's initial evaluation assessment.  In brief patient presents afebrile and hemodynamically stable for assessment of some abdominal pain.  This is in the setting of recent cholecystectomy 2 weeks ago requiring placement of PERC drain that was removed yesterday by Dr. Christian Mate in clinic.  Seems patient's pain came on again fairly acutely and was described as sharp and aching throughout the abdomen.  Labs concerning for acute pancreatitis with a lipase of 6933 and new elevation of his LFTs with a AST of 984 and ALT of 937 compared to 41 107 13 days ago respectively.  His alk phos is also slightly elevated at 300 compared to 170.  T bili is 2.1 compared to 1.7 although it was previously noted to be as high as 2.72 weeks ago.  CBC has slight leukocytosis with WBC count of 13.4 but no acute anemia or abnormal platelets.  Per discussion with on-call surgeon Dr. Peyton Najjar no indication for antibiotics at this time.  CT remarkable for: 1. Status post cholecystectomy with evidence of Acute Pancreatitis. Inflammation in the lesser sac contiguous with the gallbladder fossa (see #2). Secondary inflammation of the duodenum and root of the small bowel mesentery.  2. Small new 2.4 cm area of heterogeneous liver parenchyma adjacent to the gallbladder fossa is likely inflammatory in this setting and suspicious for a developing small liver abscess (coronal image 36). But no drainable fluid collection at this time. Trace free fluid in the pelvis.  3. Hyperenhancement of the residual cystic duct. Increased CBD size, although favor postoperative in nature given absence of intrahepatic ductal dilatation.  4. Postoperative changes to the ventral abdomen including granulation tissue along a drainage tract to the gallbladder fossa.  5. Partially visible right middle lobe pulmonary  ground-glass opacity is stable since March and favor scarring over acute pulmonary abnormality.  Plan is to follow-up MRCP to see if there is a obstructing stone.  If there is patient will likely require MRCP but if not he will likely be able to be admitted to surgery service here.  MRCP remarkable for: 1. MR findings consistent with acute interstitial pancreatitis. No findings for pancreatic necrosis. 2. Mild diffuse inflammation and enhancement of the common bile duct likely due to the surrounding pancreatitis. Cholangitis would be another possibility. No obstructing common bile duct stones are identified. 3. Status post cholecystectomy. No complicating features such as bile leak or abscess. Small not unexpected postop fluid collection in the gallbladder fossa. 4. Mild diffuse inflammation of the colon possibly inflammatory or infectious colitis.  This was reviewed by Dr. Christian Mate who took over for Dr. Peyton Najjar and recommended medicine addition.  Patient admitted to medicine service.  Culture sent and 1 dose of Zosyn given findings on MRCP, possible cholangitis.    Lucrezia Starch, MD 04/05/20 [phone]

## 2020-04-05 NOTE — ED Provider Notes (Signed)
[facility] Emergency Department Provider Note  ____________________________________________   Event Date/Time   First MD Initiated Contact with Patient 04/05/20 [id number]     (approximate)  I have reviewed the triage vital signs and the nursing notes.   HISTORY  Chief Complaint No chief complaint on file.  The patient and/or family speak(s) Spanish.  They understand they have the right to the use of a hospital interpreter, however at this time they prefer to speak directly with me in Spanish.  They know that they can ask for an interpreter at any time.   HPI Matthew Stevenson is a 40 y.o. male who had a cholecystectomy about 2 weeks ago with drain placement and the drain was removed yesterday by Dr. Claudine Moutonodenberg in clinic.  The patient presents tonight with acute onset and severe sharp and aching abdominal pain throughout his abdomen.  He said that it started not long after the drain was removed.  He is also had multiple episodes of vomiting and persistent nausea.  The pain is severe and it started up high in his abdomen but has expanded and radiated throughout his abdomen at this time.  Nothing in particular makes it better and any amount of movement or trying to eat or drink anything makes it worse.  He said it is much more severe than before he had his cholecystectomy.  He denies fevers, chest pain, and shortness of breath.  He has had a normal bowel movement and is passing gas.  No diarrhea, no constipation.  He has not been able to eat or drink very much recently.         Past Medical History:  Diagnosis Date  . Acute gallstone pancreatitis 03/20/2020  . Diabetes mellitus without complication [facility](HCC)     Patient Active Problem List   Diagnosis Date Noted  . Status post laparoscopic cholecystectomy 04/04/2020  . Type 2 diabetes mellitus with complication, without long-term current use of insulin (HCC) 03/21/2020  . Hyperglycemia 03/20/2020  . Transaminitis  03/20/2020  . Hyponatremia 03/20/2020    Past Surgical History:  Procedure Laterality Date  . CHOLECYSTECTOMY  03/22/2020    Prior to Admission medications   Medication Sig Start Date End Date Taking? Authorizing Provider  acetaminophen (TYLENOL) 325 MG tablet Take 2 tablets (650 mg total) by mouth every 8 (eight) hours as needed for mild pain. 03/23/20 04/22/20  Tonna BoehringerSakai, Isami, DO  ibuprofen (ADVIL) 800 MG tablet Take 1 tablet (800 mg total) by mouth every 8 (eight) hours as needed for mild pain or moderate pain. 03/23/20   Sung AmabileSakai, Isami, DO  metFORMIN (GLUCOPHAGE) 500 MG tablet Take 1 tablet (500 mg total) by mouth 2 (two) times daily with a meal. 03/24/20 03/24/21  Lurene ShadowAyiku, Bernard, MD    Allergies Patient has no known allergies.  Family History  Family history unknown: Yes    Social History Social History   Tobacco Use  . Smoking status: Never Smoker  . Smokeless tobacco: Never Used  Substance Use Topics  . Alcohol use: Yes    Comment: soc  . Drug use: Never    Review of Systems Constitutional: No fever/chills Eyes: No visual changes. ENT: No sore throat. Cardiovascular: Denies chest pain. Respiratory: Denies shortness of breath. Gastrointestinal: Severe abdominal pain with nausea and vomiting. Genitourinary: Negative for dysuria. Musculoskeletal: Negative for neck pain.  Negative for back pain. Integumentary: Negative for rash. Neurological: Negative for headaches, focal weakness or numbness.   ____________________________________________   PHYSICAL EXAM:  VITAL SIGNS: ED Triage Vitals  Enc Vitals Group     BP 04/05/20 0116 126/90     Pulse Rate 04/05/20 0116 71     Resp 04/05/20 0116 18     Temp 04/05/20 0116 (!) 97.5 F (36.4 C)     Temp Source 04/05/20 0116 Oral     SpO2 04/05/20 0116 98 %     Weight 04/05/20 0117 79.4 kg (175 lb)     Height 04/05/20 0117 1.651 m (5\' 5" )     Head Circumference --      Peak Flow --      Pain Score 04/05/20 0117 10     Pain  Loc --      Pain Edu? --      Excl. in GC? --     Constitutional: Alert and oriented.  Peers very uncomfortable. Eyes: Conjunctivae are normal.  Head: Atraumatic. Nose: No congestion/rhinnorhea. Mouth/Throat: Patient is wearing a mask. Neck: No stridor.  No meningeal signs.   Cardiovascular: Normal rate, regular rhythm. Good peripheral circulation. Respiratory: Normal respiratory effort.  No retractions. Gastrointestinal: Soft and mildly distended.  Tenderness to palpation throughout the abdomen, worse in the epigastrium but tender throughout.  Well-appearing surgical wounds. Musculoskeletal: No lower extremity tenderness nor edema. No gross deformities of extremities. Neurologic:  Normal speech and language. No gross focal neurologic deficits are appreciated.  Skin:  Skin is warm, dry and intact. Psychiatric: Mood and affect are normal. Speech and behavior are normal.  ____________________________________________   LABS (all labs ordered are listed, but only abnormal results are displayed)  Labs Reviewed  LIPASE, BLOOD - Abnormal; Notable for the following components:      Result Value   Lipase 6,933 (*)    All other components within normal limits  COMPREHENSIVE METABOLIC PANEL - Abnormal; Notable for the following components:   Glucose, Bld 223 (*)    Total Protein 8.7 (*)    AST 984 (*)    ALT 937 (*)    Alkaline Phosphatase 300 (*)    Total Bilirubin 2.1 (*)    All other components within normal limits  CBC - Abnormal; Notable for the following components:   WBC 13.4 (*)    All other components within normal limits  URINALYSIS, COMPLETE (UACMP) WITH MICROSCOPIC - Abnormal; Notable for the following components:   Color, Urine YELLOW (*)    APPearance CLEAR (*)    Specific Gravity, Urine 1.041 (*)    Glucose, UA >=500 (*)    Ketones, ur 5 (*)    All other components within normal limits  LACTIC ACID, PLASMA - Abnormal; Notable for the following components:   Lactic  Acid, Venous 2.3 (*)    All other components within normal limits  LACTIC ACID, PLASMA - Abnormal; Notable for the following components:   Lactic Acid, Venous 2.0 (*)    All other components within normal limits  BILIRUBIN, DIRECT - Abnormal; Notable for the following components:   Bilirubin, Direct 1.5 (*)    All other components within normal limits  RESP PANEL BY RT-PCR (FLU A&B, COVID) [id number]  PROCALCITONIN  TROPONIN I (HIGH SENSITIVITY)  TROPONIN I (HIGH SENSITIVITY)   ____________________________________________  EKG  ED ECG REPORT I, 04/07/20, the attending physician, personally viewed and interpreted this ECG.  Date: 04/05/2020 EKG Time: 1:24 AM Rate: 64 Rhythm: normal sinus rhythm QRS Axis: normal Intervals: normal ST/T Wave abnormalities: normal Narrative Interpretation: no evidence of acute ischemia  ____________________________________________  RADIOLOGY I,  Loleta Rose, personally viewed and evaluated these images (plain radiographs) as part of my medical decision making, as well as reviewing the written report by the radiologist.  ED MD interpretation: Acute pancreatitis, possible developing liver abscess/infection, inflammation around the biliary system with dilated CBD but which the radiologist thinks is postoperative.  Official radiology report(s): CT ABDOMEN PELVIS W CONTRAST  Result Date: 04/05/2020 CLINICAL DATA:  40 year old male with abdominal pain, nausea vomiting. Laparoscopic cholecystectomy 2 weeks ago with postoperative drain removed yesterday. EXAM: CT ABDOMEN AND PELVIS WITH CONTRAST TECHNIQUE: Multidetector CT imaging of the abdomen and pelvis was performed using the standard protocol following bolus administration of intravenous contrast. CONTRAST:  OMNIPAQUE IOHEXOL 300 MG/ML  SOLN COMPARISON:  CT Abdomen and Pelvis 03/20/2020, MRI and MRCP that day. FINDINGS: Lower chest: Partially visible right middle lobe ground-glass opacity (series  4, image 3 today) is unchanged. Therefore favor scarring over acute pulmonary abnormality. Stable cardiac size at the upper limits of normal. No pericardial or pleural effusion. Visible lower lobes are clear. Hepatobiliary: Absent gallbladder now with mild hyperenhancement of the residual cystic duct (coronal image 41). Small 17 x 17 x 24 mm (AP by transverse by CC) area of heterogeneous hypodensity of the liver parenchyma at the gallbladder fossa (coronal image 36). CBD measures 8-9 mm diameter, but there is no intrahepatic biliary ductal dilatation. No filling defect identified within the CBD which seems to taper to the duodenum. No drainable fluid. Pancreas: New peripancreatic inflammatory stranding in the lesser sac centered at the pancreatic body. No pancreatic ductal dilatation or necrosis. Spleen: Negative. Adrenals/Urinary Tract: Stable and negative. Symmetric renal enhancement and contrast excretion with no hydronephrosis or hydroureter. Stomach/Bowel: Negative large bowel and appendix (series 2, image 61). No dilated small bowel. Negative terminal ileum. Inflammation at the root of the small bowel mesentery mostly affecting the duodenum which is decompressed. Lesser involvement of the distal stomach, proximal jejunum. No free air. Postoperative changes to the ventral abdominal wall since the prior CT, including a subtle tract from the ventral abdomen to the gallbladder fossa probably related to the removed drain (coronal image 28). Vascular/Lymphatic: Major arterial structures are patent with no atherosclerosis identified. Portal venous system is patent. Small reactive appearing upper abdominal lymph nodes are not significantly changed. Reproductive: Negative. Other: Trace free fluid in the pelvis suspected on series 2, image 81. Musculoskeletal: Stable.  No acute osseous abnormality identified. IMPRESSION: 1. Status post cholecystectomy with evidence of Acute Pancreatitis. Inflammation in the lesser sac  contiguous with the gallbladder fossa (see #2). Secondary inflammation of the duodenum and root of the small bowel mesentery. 2. Small new 2.4 cm area of heterogeneous liver parenchyma adjacent to the gallbladder fossa is likely inflammatory in this setting and suspicious for a developing small liver abscess (coronal image 36). But no drainable fluid collection at this time. Trace free fluid in the pelvis. 3. Hyperenhancement of the residual cystic duct. Increased CBD size, although favor postoperative in nature given absence of intrahepatic ductal dilatation. 4. Postoperative changes to the ventral abdomen including granulation tissue along a drainage tract to the gallbladder fossa. 5. Partially visible right middle lobe pulmonary ground-glass opacity is stable since March and favor scarring over acute pulmonary abnormality. Electronically Signed   By: Odessa Fleming M.D.   On: 04/05/2020 04:59    ____________________________________________   PROCEDURES   Procedure(s) performed (including Critical Care):  Procedures   ____________________________________________   INITIAL IMPRESSION / MDM / ASSESSMENT AND PLAN / ED COURSE  As part of my medical decision making, I reviewed the following data within the electronic MEDICAL RECORD NUMBER Nursing notes reviewed and incorporated, Labs reviewed , EKG interpreted , Old chart reviewed, Patient signed out to Dr. Antoine Primas, A consult was requested and obtained from this/these consultant(s) Surgery (Dr. Maia Plan) and reviewed Notes from prior ED visits   Differential diagnosis includes, but is not limited to, postoperative complication such as abscess, acute intra-abdominal bleeding, retained biliary stone, pancreatitis, other unrelated abdominal infection such as appendicitis.  The patient looks very uncomfortable although he is nontoxic in appearance and I am giving morphine 4 mg IV and Zofran 4 mg IV.  Lipase is still pending which may indicate that it is  high.  His CBC is notable for a leukocytosis of 13.4.  Comprehensive metabolic panel is notable for normal electrolytes but LFTs that are elevated including AST and ALT that are nearly at thousand and a total bilirubin of 2.1.  High-sensitivity troponin is 8.  Lipase and lactic acid are both still pending.  I will proceed with CT scan of the abdomen pelvis with IV contrast given his recent surgical procedures including removal of the drain yesterday and I anticipate a consult with general surgery will be needed.  I also ordered 1 L lactated Ringer IV bolus.       Clinical Course as of 04/05/20 0757  Fri Apr 05, 2020  0501 Lactic Acid, Venous(!!): 2.3 [CF]  0501 Lipase(!): 6,933 [CF]  0512 CT scan is notable for acute pancreatitis and inflammation around the liver and gallbladder fossa.  There is also a question of a developing liver abscess although the radiologist says there is nothing drainable.  I called and spoke by phone with Dr. Maia Plan with surgery who is going to review the imaging and call me back.  I ordered another 1 L of lactated Ringer's. [CF]  7353 I discussed the case in person with Dr. Maia Plan.  The current plan is for the patient to have an MRCP.  If there is no obstruction, the surgery team will admit the patient.  If there is an obstruction requiring ERCP, it will have to be verified that ERCP is available at [facility] this weekend.  If Dr. Servando Snare is not available for ERCP, the patient will need to be transferred to [facility] or another facility.  Dr. Maia Plan agreed with the plan to hold off on antibiotics for now after he reviewed the CT scan and does not feel that there is evidence of acute infection.  Dr. Claudine Mouton will be the surgeon to follow-up at 7 AM. [CF]  0631 Procalcitonin: 0.14 [CF]  2992 Lactic Acid, Venous(!!): 2.0 Slight decrease in lactic acid from initial.  The patient is noted to have a lactate>2. With the current information available to me, I don't think the patient  is in severe sepsis. The lactate>2, is related to liver failure  and volume depletion.  Dr. Maia Plan agreed with this assessment.  [CF]    Clinical Course User Index [CF] Loleta Rose, MD     ____________________________________________  FINAL CLINICAL IMPRESSION(S) / ED DIAGNOSES  Final diagnoses:  Acute pancreatitis, unspecified complication status, unspecified pancreatitis type     MEDICATIONS GIVEN DURING THIS VISIT:  Medications  insulin aspart (novoLOG) injection 0-15 Units (has no administration in time range)  ondansetron (ZOFRAN) 4 MG/2ML injection (4 mg  Given 04/05/20 0345)  morphine 4 MG/ML injection 4 mg (4 mg Intravenous Given 04/05/20 0402)  lactated ringers bolus 1,000 mL (  0 mLs Intravenous Stopped 04/05/20 0516)  iohexol (OMNIPAQUE) 300 MG/ML solution 100 mL (100 mLs Intravenous Contrast Given 04/05/20 0418)  lactated ringers bolus 1,000 mL (0 mLs Intravenous Stopped 04/05/20 0609)  gadobutrol (GADAVIST) 1 MMOL/ML injection 8 mL (8 mLs Intravenous Contrast Given 04/05/20 0735)     ED Discharge Orders    None      *Please note:  Garrie Woodin was evaluated in Emergency Department on 04/05/2020 for the symptoms described in the history of present illness. He was evaluated in the context of the global COVID-19 pandemic, which necessitated consideration that the patient might be at risk for infection with the SARS-CoV-2 virus that causes COVID-19. Institutional protocols and algorithms that pertain to the evaluation of patients at risk for COVID-19 are in a state of rapid change based on information released by regulatory bodies including the CDC and federal and state organizations. These policies and algorithms were followed during the patient's care in the ED.  Some ED evaluations and interventions may be delayed as a result of limited staffing during and after the pandemic.*  Note:  This document was prepared using Dragon voice recognition software and may include  unintentional dictation errors.   Loleta Rose, MD 04/05/20 [phone]

## 2020-04-05 NOTE — ED Notes (Signed)
Pt in MRI at this time 

## 2020-04-05 NOTE — ED Notes (Signed)
Lab at bedside

## 2020-04-05 NOTE — ED Notes (Signed)
Critical lab  Latic acid-2.3 Dr. York Cerise was notified face to face

## 2020-04-05 NOTE — Consult Note (Signed)
Consultation  Referring Provider:     Dr Joylene Igo Admit date 04/06/19 Consult date        04/06/19 Reason for Consultation:     Acute pancreatitis         HPI:   Grayden Burley is a 40 y.o. male who has diabetes. He ahd acute gallstone pancreatitis which was done 2 weeks ago (Dr Claudine Mouton), status post drain placement and removal on 04/04/20.  Admitted today with 10/01 abdominal pain with NV, subjective fever Lipase >6000, transaminases  900's; to bili 2.1, direct 1.5, lactate 2.3, wbc 13.4 covid and flu negative Has been started on zosyn and flagyl CT IMPRESSION: 1. Status post cholecystectomy with evidence of Acute Pancreatitis. Inflammation in the lesser sac contiguous with the gallbladder fossa (see #2). Secondary inflammation of the duodenum and root of the small bowel mesentery. 2. Small new 2.4 cm area of heterogeneous liver parenchyma adjacent to the gallbladder fossa is likely inflammatory in this setting and suspicious for a developing small liver abscess (coronal image 36). But no drainable fluid collection at this time. Trace free fluid in the pelvis. 3. Hyperenhancement of the residual cystic duct. Increased CBD size, although favor postoperative in nature given absence of intrahepatic ductal dilatation. 4. Postoperative changes to the ventral abdomen including granulation tissue along a drainage tract to the gallbladder fossa. 5. Partially visible right middle lobe pulmonary ground-glass opacity is stable since March and favor scarring over acute pulmonary abnormality. Electronically Signed   By: Odessa Fleming M.D.   On: 04/05/2020 04:59  MRCP- IMPRESSION: 1. MR findings consistent with acute interstitial pancreatitis. No findings for pancreatic necrosis. 2. Mild diffuse inflammation and enhancement of the common bile duct likely due to the surrounding pancreatitis. Cholangitis would be another possibility. No obstructing common bile duct stones  are identified. 3. Status post cholecystectomy. No complicating features such as bile leak or abscess. Small not unexpected postop fluid collection in the gallbladder fossa. 4. Mild diffuse inflammation of the colon possibly inflammatory or infectious colitis.  Patient reports he had his biliary drain removed yesterday- felt a little unusual in his abdomen after that but not pain. States the abdominal pain started later that night he vomited a couple times, denies any hematemesis. Presented to ed, with the above findings. He has been afebrile.  States other than mild intermittent reflux he does not have any gi problems normally and denies constipation/diarrhea/melena/hematohezia, acholic stools. States the pain medicine has helped his abdominal pain now- describes it was a generalized with most of the pain in the epigastrum and just below the umbilius on either side of it. States the pain was worse than his gallstone pain prior to [facility]. Noted he did not have any findings of choledocholithiasis prior to or after [facility]. He states that prior to [facility] he smoked cigarettes occasionally, not daily and would have some etoh but not heavy/regularly. Denies illicits and herbals. States he rarely uses and otcs except for rare ibuprofen. Denies any tattoos.  PREVIOUS ENDOSCOPIES:               Past Medical History:  Diagnosis Date  . Acute gallstone pancreatitis 03/20/2020  . Diabetes mellitus without complication [facility])     Past Surgical History:  Procedure Laterality Date  . CHOLECYSTECTOMY  03/22/2020    Family History  Family history unknown: Yes  no GI family history  Social History   Tobacco Use  . Smoking status: Never Smoker  . Smokeless tobacco: Never Used  Substance  Use Topics  . Alcohol use: Yes    Comment: soc  . Drug use: Never    Prior to Admission medications   Medication Sig Start Date End Date Taking? Authorizing Provider  ibuprofen (ADVIL) 800 MG tablet Take 1 tablet (800 mg  total) by mouth every 8 (eight) hours as needed for mild pain or moderate pain. 03/23/20  Yes Sakai, Isami, DO  metFORMIN (GLUCOPHAGE) 500 MG tablet Take 1 tablet (500 mg total) by mouth 2 (two) times daily with a meal. 03/24/20 03/24/21 Yes Lurene ShadowAyiku, Bernard, MD  acetaminophen (TYLENOL) 325 MG tablet Take 2 tablets (650 mg total) by mouth every 8 (eight) hours as needed for mild pain. Patient not taking: Reported on 04/05/2020 03/23/20 04/22/20  Sung AmabileSakai, Isami, DO    Current Facility-Administered Medications  Medication Dose Route Frequency Provider Last Rate Last Admin  . 0.9 %  sodium chloride infusion   Intravenous Continuous Agbata, Tochukwu, MD 125 mL/hr at 04/05/20 1024 New Bag at 04/05/20 1024  . enoxaparin (LOVENOX) injection 40 mg  40 mg Subcutaneous Q24H Agbata, Tochukwu, MD      . insulin aspart (novoLOG) injection 0-15 Units  0-15 Units Subcutaneous Q4H Agbata, Tochukwu, MD   2 Units at 04/05/20 1227  . metroNIDAZOLE (FLAGYL) IVPB 500 mg  500 mg Intravenous Q8H Agbata, Tochukwu, MD 100 mL/hr at 04/05/20 1227 500 mg at 04/05/20 1227  . morphine 2 MG/ML injection 2 mg  2 mg Intravenous Q4H PRN Agbata, Tochukwu, MD      . ondansetron (ZOFRAN) tablet 4 mg  4 mg Oral Q6H PRN Agbata, Tochukwu, MD       Or  . ondansetron (ZOFRAN) injection 4 mg  4 mg Intravenous Q6H PRN Agbata, Tochukwu, MD      . piperacillin-tazobactam (ZOSYN) IVPB 3.375 g  3.375 g Intravenous Q8H Agbata, Tochukwu, MD       Current Outpatient Medications  Medication Sig Dispense Refill  . ibuprofen (ADVIL) 800 MG tablet Take 1 tablet (800 mg total) by mouth every 8 (eight) hours as needed for mild pain or moderate pain. 30 tablet 0  . metFORMIN (GLUCOPHAGE) 500 MG tablet Take 1 tablet (500 mg total) by mouth 2 (two) times daily with a meal. 60 tablet 0  . acetaminophen (TYLENOL) 325 MG tablet Take 2 tablets (650 mg total) by mouth every 8 (eight) hours as needed for mild pain. (Patient not taking: Reported on 04/05/2020) 40 tablet 0     Allergies as of 04/05/2020  . (No Known Allergies)     Review of Systems:    All systems reviewed and negative except where noted in HPI.   Physical Exam:  Vital signs in last 24 hours: Temp:  [97.5 F (36.4 C)-98 F (36.7 C)] 98 F (36.7 C) (03/18 0337) Pulse Rate:  [64-71] 65 (03/18 1030) Resp:  [18-24] 19 (03/18 1030) BP: (117-171)/(63-90) 117/71 (03/18 1030) SpO2:  [93 %-100 %] 93 % (03/18 1030) Weight:  [79.4 kg] 79.4 kg (03/18 0117)   General:   Pleasant man in NAD Head:  Normocephalic and atraumatic. Eyes:   No icterus.   Conjunctiva pink. Ears:  Normal auditory acuity. Mouth: Mucosa pink moist, no lesions. Neck:  Supple; no masses felt Lungs:  Respirations even and unlabored. Lungs clear to auscultation bilaterally.   No wheezes, crackles, or rhonchi.  Heart:  S1S2, RRR, no MRG. No edema. Abdomen:   Flat, soft, mildly protuberant, tender generally mosty in the epigastic area and lower abdomen. 3 small incisions,  well healing.  Normal bowel sounds. No appreciable masses or hepatomegaly. No rebound signs or other peritoneal signs. Rectal:  Not performed.  Msk:  MAEW x4, No clubbing or cyanosis. Strength 5/5. Symmetrical without gross deformities. Neurologic:  Alert and  oriented x4;  Cranial nerves II-XII intact.  Skin:  Warm, dry, pink without significant lesions or rashes. Minimal diaphoresis Psych:  Alert and cooperative. Normal affect.  LAB RESULTS: Recent Labs    04/05/20 0125  WBC 13.4*  HGB 14.5  HCT 43.4  PLT 375   BMET Recent Labs    04/05/20 0125  NA 136  K 4.0  CL 100  CO2 26  GLUCOSE 223*  BUN 16  CREATININE 0.80  CALCIUM 9.6   LFT Recent Labs    04/05/20 0125 04/05/20 0610  PROT 8.7*  --   ALBUMIN 4.3  --   AST 984*  --   ALT 937*  --   ALKPHOS 300*  --   BILITOT 2.1*  --   BILIDIR  --  1.5*   PT/INR No results for input(s): LABPROT, INR in the last 72 hours.  STUDIES: CT ABDOMEN PELVIS W CONTRAST  Result Date:  04/05/2020 CLINICAL DATA:  40 year old male with abdominal pain, nausea vomiting. Laparoscopic cholecystectomy 2 weeks ago with postoperative drain removed yesterday. EXAM: CT ABDOMEN AND PELVIS WITH CONTRAST TECHNIQUE: Multidetector CT imaging of the abdomen and pelvis was performed using the standard protocol following bolus administration of intravenous contrast. CONTRAST:  OMNIPAQUE IOHEXOL 300 MG/ML  SOLN COMPARISON:  CT Abdomen and Pelvis 03/20/2020, MRI and MRCP that day. FINDINGS: Lower chest: Partially visible right middle lobe ground-glass opacity (series 4, image 3 today) is unchanged. Therefore favor scarring over acute pulmonary abnormality. Stable cardiac size at the upper limits of normal. No pericardial or pleural effusion. Visible lower lobes are clear. Hepatobiliary: Absent gallbladder now with mild hyperenhancement of the residual cystic duct (coronal image 41). Small 17 x 17 x 24 mm (AP by transverse by CC) area of heterogeneous hypodensity of the liver parenchyma at the gallbladder fossa (coronal image 36). CBD measures 8-9 mm diameter, but there is no intrahepatic biliary ductal dilatation. No filling defect identified within the CBD which seems to taper to the duodenum. No drainable fluid. Pancreas: New peripancreatic inflammatory stranding in the lesser sac centered at the pancreatic body. No pancreatic ductal dilatation or necrosis. Spleen: Negative. Adrenals/Urinary Tract: Stable and negative. Symmetric renal enhancement and contrast excretion with no hydronephrosis or hydroureter. Stomach/Bowel: Negative large bowel and appendix (series 2, image 61). No dilated small bowel. Negative terminal ileum. Inflammation at the root of the small bowel mesentery mostly affecting the duodenum which is decompressed. Lesser involvement of the distal stomach, proximal jejunum. No free air. Postoperative changes to the ventral abdominal wall since the prior CT, including a subtle tract from the  ventral abdomen to the gallbladder fossa probably related to the removed drain (coronal image 28). Vascular/Lymphatic: Major arterial structures are patent with no atherosclerosis identified. Portal venous system is patent. Small reactive appearing upper abdominal lymph nodes are not significantly changed. Reproductive: Negative. Other: Trace free fluid in the pelvis suspected on series 2, image 81. Musculoskeletal: Stable.  No acute osseous abnormality identified. IMPRESSION: 1. Status post cholecystectomy with evidence of Acute Pancreatitis. Inflammation in the lesser sac contiguous with the gallbladder fossa (see #2). Secondary inflammation of the duodenum and root of the small bowel mesentery. 2. Small new 2.4 cm area of heterogeneous liver parenchyma adjacent to the  gallbladder fossa is likely inflammatory in this setting and suspicious for a developing small liver abscess (coronal image 36). But no drainable fluid collection at this time. Trace free fluid in the pelvis. 3. Hyperenhancement of the residual cystic duct. Increased CBD size, although favor postoperative in nature given absence of intrahepatic ductal dilatation. 4. Postoperative changes to the ventral abdomen including granulation tissue along a drainage tract to the gallbladder fossa. 5. Partially visible right middle lobe pulmonary ground-glass opacity is stable since March and favor scarring over acute pulmonary abnormality. Electronically Signed   By: Odessa Fleming M.D.   On: 04/05/2020 04:59   MR 3D Recon At Scanner  Result Date: 04/05/2020 CLINICAL DATA:  Acute pancreatitis. History of recent cholecystectomy. EXAM: MRI ABDOMEN WITH CONTRAST (WITH MRCP) TECHNIQUE: Multiplanar multisequence MR imaging of the abdomen was performed following the administration of intravenous contrast. Heavily T2-weighted images of the biliary and pancreatic ducts were obtained, and three-dimensional MRCP images were rendered by post processing. CONTRAST:  12mL  GADAVIST GADOBUTROL 1 MMOL/ML IV SOLN COMPARISON:  CT scan, same date. FINDINGS: Lower chest: The lung bases are clear of an acute process. No pulmonary lesions or pleural effusion. No pericardial effusion. Hepatobiliary: No hepatic lesions or intrahepatic biliary dilatation. The gallbladder is surgically absent. No complicating features such as bile leak or abscess. Small not unexpected postoperative fluid collection in the gallbladder fossa. Normal caliber and course of the common bile duct. There is mild diffuse inflammation of common bile duct likely due to the surrounding pancreatitis. Cholangitis would be another possibility. No evidence of enhancement of intrahepatic ducts. No findings for common bile duct stones. Pancreas: MR findings consistent with acute interstitial pancreatitis. Pancreas is mildly enlarged and diffusely inflamed with peripancreatic inflammation and fluid. No findings for pancreatic necrosis. Normal main pancreatic duct. Spleen:  Normal size.  No focal lesions. Adrenals/Urinary Tract: The adrenal glands kidneys are unremarkable. Stomach/Bowel: There appears to be mild diffuse inflammation of the colon possibly inflammatory or infectious colitis. There is also moderate inflammation of the duodenum due to the surrounding pancreatitis. Vascular/Lymphatic: The aorta and branch vessels are normal. The major venous structures are patent. There are small scattered upper abdominal lymph nodes which are likely reactive. Other: No focal abdominal fluid collections. No abdominal wall hernia. Musculoskeletal: No significant bony findings. IMPRESSION: 1. MR findings consistent with acute interstitial pancreatitis. No findings for pancreatic necrosis. 2. Mild diffuse inflammation and enhancement of the common bile duct likely due to the surrounding pancreatitis. Cholangitis would be another possibility. No obstructing common bile duct stones are identified. 3. Status post cholecystectomy. No  complicating features such as bile leak or abscess. Small not unexpected postop fluid collection in the gallbladder fossa. 4. Mild diffuse inflammation of the colon possibly inflammatory or infectious colitis. Electronically Signed   By: Rudie Meyer M.D.   On: 04/05/2020 08:00   MR ABDOMEN MRCP W WO CONTAST  Result Date: 04/05/2020 CLINICAL DATA:  Acute pancreatitis. History of recent cholecystectomy. EXAM: MRI ABDOMEN WITH CONTRAST (WITH MRCP) TECHNIQUE: Multiplanar multisequence MR imaging of the abdomen was performed following the administration of intravenous contrast. Heavily T2-weighted images of the biliary and pancreatic ducts were obtained, and three-dimensional MRCP images were rendered by post processing. CONTRAST:  36mL GADAVIST GADOBUTROL 1 MMOL/ML IV SOLN COMPARISON:  CT scan, same date. FINDINGS: Lower chest: The lung bases are clear of an acute process. No pulmonary lesions or pleural effusion. No pericardial effusion. Hepatobiliary: No hepatic lesions or intrahepatic biliary dilatation. The gallbladder  is surgically absent. No complicating features such as bile leak or abscess. Small not unexpected postoperative fluid collection in the gallbladder fossa. Normal caliber and course of the common bile duct. There is mild diffuse inflammation of common bile duct likely due to the surrounding pancreatitis. Cholangitis would be another possibility. No evidence of enhancement of intrahepatic ducts. No findings for common bile duct stones. Pancreas: MR findings consistent with acute interstitial pancreatitis. Pancreas is mildly enlarged and diffusely inflamed with peripancreatic inflammation and fluid. No findings for pancreatic necrosis. Normal main pancreatic duct. Spleen:  Normal size.  No focal lesions. Adrenals/Urinary Tract: The adrenal glands kidneys are unremarkable. Stomach/Bowel: There appears to be mild diffuse inflammation of the colon possibly inflammatory or infectious colitis. There is  also moderate inflammation of the duodenum due to the surrounding pancreatitis. Vascular/Lymphatic: The aorta and branch vessels are normal. The major venous structures are patent. There are small scattered upper abdominal lymph nodes which are likely reactive. Other: No focal abdominal fluid collections. No abdominal wall hernia. Musculoskeletal: No significant bony findings. IMPRESSION: 1. MR findings consistent with acute interstitial pancreatitis. No findings for pancreatic necrosis. 2. Mild diffuse inflammation and enhancement of the common bile duct likely due to the surrounding pancreatitis. Cholangitis would be another possibility. No obstructing common bile duct stones are identified. 3. Status post cholecystectomy. No complicating features such as bile leak or abscess. Small not unexpected postop fluid collection in the gallbladder fossa. 4. Mild diffuse inflammation of the colon possibly inflammatory or infectious colitis. Electronically Signed   By: Rudie Meyer M.D.   On: 04/05/2020 08:00       Impression / Plan:   1. Acute pancreatitis/transaminitis- less likely cholangitis -may have had some microlithiasis or a little sludge- note that pancreatitis can cause inflammation of the bile ducts/duodenum/reactive hepatitis- appreciate Dr Vedia Coffer note; agree with pain management/ aggressive fluid resuscitation- recommend rechecking liver enzymes, lipase in am. Will also screen viral hepatitis. Agree with Zosyn for the possible infectious etiology Interpreter services with  [facility] and Kalman Shan-  Thank you very much for this consult. These services were provided by Vevelyn Pat, NP-C, in collaboration with Regis Bill, MD, with whom I have discussed this patient in full.   Vevelyn Pat, NP-C

## 2020-04-05 NOTE — Consult Note (Signed)
SURGICAL CONSULTATION NOTE   HISTORY OF PRESENT ILLNESS (HPI):  40 y.o. male presented to [facility] ED for evaluation of abdominal pain since last night. Patient reports he has surgery for cholecystitis on 03/22/2020.  At that time patient came with cholecystitis to the ED.  He was admitted for robotic assisted laparoscopic cholecystectomy.  Intraoperatively he was found with a distended tense edematous gallbladder with extensive early adhesions.  He had drain placement.  He was able to be discharged on postoperative day #2.  He was seen by his surgeon yesterday and the drain was removed.  Patient reported that he was doing well until last night that he started having severe generalized abdominal pain.  Pain upper abdomen.  Pain does not radiate to the part of the body.  There has been no alleviating or aggravating factors.  At the ED he was found with my leukocytosis of 13,000.  He was also found with elevated AST/ALT and bilirubin.  Bilirubin up to 2.1.  On the previous admission he also had elevated bilirubin and alkaline phosphatase but MRCP was negative for choledocholithiasis.  At that time the lipase was 246.  Today the lipase is 6900.  CT scan of the abdomen and pelvis shows inflammation in the pancreas.  There is also dilated common bile duct.  I personally evaluated the images.  There is no significant fluid collection.  Surgery is consulted by Dr. York Cerise in this context for evaluation and management of postoperative pancreatitis after cholecystectomy.  PAST MEDICAL HISTORY (PMH):  Past Medical History:  Diagnosis Date   Acute gallstone pancreatitis 03/20/2020   Diabetes mellitus without complication (HCC)      PAST SURGICAL HISTORY (PSH):  Past Surgical History:  Procedure Laterality Date   CHOLECYSTECTOMY  03/22/2020     MEDICATIONS:  Prior to Admission medications   Medication Sig Start Date End Date Taking? Authorizing Provider  acetaminophen (TYLENOL) 325 MG tablet Take 2 tablets  (650 mg total) by mouth every 8 (eight) hours as needed for mild pain. 03/23/20 04/22/20  Tonna Boehringer, Isami, DO  ibuprofen (ADVIL) 800 MG tablet Take 1 tablet (800 mg total) by mouth every 8 (eight) hours as needed for mild pain or moderate pain. 03/23/20   Sung Amabile, DO  metFORMIN (GLUCOPHAGE) 500 MG tablet Take 1 tablet (500 mg total) by mouth 2 (two) times daily with a meal. 03/24/20 03/24/21  Lurene Shadow, MD     ALLERGIES:  No Known Allergies   SOCIAL HISTORY:  Social History   Socioeconomic History   Marital status: Married    Spouse name: Not on file   Number of children: Not on file   Years of education: Not on file   Highest education level: Not on file  Occupational History   Not on file  Tobacco Use   Smoking status: Never Smoker   Smokeless tobacco: Never Used  Substance and Sexual Activity   Alcohol use: Yes    Comment: soc   Drug use: Never   Sexual activity: Not on file  Other Topics Concern   Not on file  Social History Narrative   Not on file   Social Determinants of Health   Financial Resource Strain: Not on file  Food Insecurity: Not on file  Transportation Needs: Not on file  Physical Activity: Not on file  Stress: Not on file  Social Connections: Not on file  Intimate Partner Violence: Not on file      FAMILY HISTORY:  Family History  Family history  unknown: Yes     REVIEW OF SYSTEMS:  Constitutional: denies weight loss, fever, chills, or sweats  Eyes: denies any other vision changes, history of eye injury  ENT: denies sore throat, hearing problems  Respiratory: denies shortness of breath, wheezing  Cardiovascular: denies chest pain, palpitations  Gastrointestinal: Positive abdominal pain, nausea and vomiting Genitourinary: denies burning with urination or urinary frequency Musculoskeletal: denies any other joint pains or cramps  Skin: denies any other rashes or skin discolorations  Neurological: denies any other headache, dizziness,  weakness  Psychiatric: denies any other depression, anxiety   All other review of systems were negative   VITAL SIGNS:  Temp:  [97.5 F (36.4 C)-98 F (36.7 C)] 98 F (36.7 C) (03/18 0337) Pulse Rate:  [64-79] 64 (03/18 0506) Resp:  [18-24] 20 (03/18 0506) BP: (126-171)/(63-90) 171/63 (03/18 0506) SpO2:  [94 %-100 %] 99 % (03/18 0506) Weight:  [79.4 kg-81.2 kg] 79.4 kg (03/18 0117)     Height: 5\' 5"  (165.1 cm) Weight: 79.4 kg BMI (Calculated): 29.12   INTAKE/OUTPUT:  This shift: No intake/output data recorded.  Last 2 shifts: @IOLAST2SHIFTS @   PHYSICAL EXAM:  Constitutional:  -- Normal body habitus  -- Awake, alert, and oriented x3  Eyes:  -- Pupils equally round and reactive to light  -- No scleral icterus  Ear, nose, and throat:  -- No jugular venous distension  Pulmonary:  -- No crackles  -- Equal breath sounds bilaterally -- Breathing non-labored at rest Cardiovascular:  -- S1, S2 present  -- No pericardial rubs Gastrointestinal:  -- Abdomen soft, tender in the upper abdomen, non-distended, no guarding or rebound tenderness -- No abdominal masses appreciated, pulsatile or otherwise  Musculoskeletal and Integumentary:  -- Wounds: None appreciated -- Extremities: B/L UE and LE FROM, hands and feet warm, no edema  Neurologic:  -- Motor function: intact and symmetric -- Sensation: intact and symmetric   Labs:  CBC Latest Ref Rng & Units 04/05/2020 03/23/2020 03/22/2020  WBC 4.0 - 10.5 K/uL 13.4(H) 11.4(H) 15.5(H)  Hemoglobin 13.0 - 17.0 g/dL 05/23/2020 11.7(L) 13.5  Hematocrit 39.0 - 52.0 % 43.4 34.8(L) 38.7(L)  Platelets 150 - 400 K/uL 375 312 269   CMP Latest Ref Rng & Units 04/05/2020 03/23/2020 03/22/2020  Glucose 70 - 99 mg/dL 05/23/2020) 05/22/2020) 096(E)  BUN 6 - 20 mg/dL 16 14 13   Creatinine 0.61 - 1.24 mg/dL 454(U 981(X  Sodium 135 - 145 mmol/L 136 133(L) 131(L)  Potassium 3.5 - 5.1 mmol/L 4.0 3.6 3.8  Chloride 98 - 111 mmol/L 100 102 99  CO2 22 - 32 mmol/L 26 22 24    Calcium 8.9 - 10.3 mg/dL 9.6 9.14) 8.3(L)  Total Protein 6.5 - 8.1 g/dL 7.82) 7.0 7.4  Total Bilirubin 0.3 - 1.2 mg/dL 2.1(H) 1.7(H) 2.7(H)  Alkaline Phos 38 - 126 U/L 300(H) 170(H) 197(H)  AST 15 - 41 U/L 984(H) 41 50(H)  ALT 0 - 44 U/L 937(H) 107(H) 143(H)   Lipase     Component Value Date/Time   LIPASE 6,933 (H) 04/05/2020 0125     Imaging studies:  EXAM: CT ABDOMEN AND PELVIS WITH CONTRAST  TECHNIQUE: Multidetector CT imaging of the abdomen and pelvis was performed using the standard protocol following bolus administration of intravenous contrast.  CONTRAST:  OMNIPAQUE IOHEXOL 300 MG/ML  SOLN  COMPARISON:  CT Abdomen and Pelvis 03/20/2020, MRI and MRCP that day.  FINDINGS: Lower chest: Partially visible right middle lobe ground-glass opacity (series 4, image 3 today) is  unchanged. Therefore favor scarring over acute pulmonary abnormality. Stable cardiac size at the upper limits of normal. No pericardial or pleural effusion. Visible lower lobes are clear.  Hepatobiliary: Absent gallbladder now with mild hyperenhancement of the residual cystic duct (coronal image 41). Small 17 x 17 x 24 mm (AP by transverse by CC) area of heterogeneous hypodensity of the liver parenchyma at the gallbladder fossa (coronal image 36). CBD measures 8-9 mm diameter, but there is no intrahepatic biliary ductal dilatation. No filling defect identified within the CBD which seems to taper to the duodenum. No drainable fluid.  Pancreas: New peripancreatic inflammatory stranding in the lesser sac centered at the pancreatic body. No pancreatic ductal dilatation or necrosis.  Spleen: Negative.  Adrenals/Urinary Tract: Stable and negative. Symmetric renal enhancement and contrast excretion with no hydronephrosis or hydroureter.  Stomach/Bowel: Negative large bowel and appendix (series 2, image 61). No dilated small bowel. Negative terminal ileum.  Inflammation at the  root of the small bowel mesentery mostly affecting the duodenum which is decompressed. Lesser involvement of the distal stomach, proximal jejunum. No free air.  Postoperative changes to the ventral abdominal wall since the prior CT, including a subtle tract from the ventral abdomen to the gallbladder fossa probably related to the removed drain (coronal image 28).  Vascular/Lymphatic: Major arterial structures are patent with no atherosclerosis identified. Portal venous system is patent. Small reactive appearing upper abdominal lymph nodes are not significantly changed.  Reproductive: Negative.  Other: Trace free fluid in the pelvis suspected on series 2, image 81.  Musculoskeletal: Stable.  No acute osseous abnormality identified.  IMPRESSION: 1. Status post cholecystectomy with evidence of Acute Pancreatitis. Inflammation in the lesser sac contiguous with the gallbladder fossa (see #2). Secondary inflammation of the duodenum and root of the small bowel mesentery.  2. Small new 2.4 cm area of heterogeneous liver parenchyma adjacent to the gallbladder fossa is likely inflammatory in this setting and suspicious for a developing small liver abscess (coronal image 36). But no drainable fluid collection at this time. Trace free fluid in the pelvis.  3. Hyperenhancement of the residual cystic duct. Increased CBD size, although favor postoperative in nature given absence of intrahepatic ductal dilatation.  4. Postoperative changes to the ventral abdomen including granulation tissue along a drainage tract to the gallbladder fossa.  5. Partially visible right middle lobe pulmonary ground-glass opacity is stable since March and favor scarring over acute pulmonary abnormality.   Electronically Signed   By: Odessa Fleming M.D.   On: 04/05/2020 04:59  Assessment/Plan:  40 y.o. male with recent robotic assisted laparoscopic cholecystectomy 2 weeks ago now with acute  pancreatitis, elevated bilirubin, elevated AST ALT.  Patient with acute pancreatitis.  With current lab work-up and images from the CT scan I am concerned that this patient may be having a retained sludge or stone.  I will get MRCP to see if there is any retained stone/sludge.  If MRCP is negative patient can be managed with conservative treatment with IV fluids and pain management, but if MRCP is positive for retained stone he may need to have ERCP.  Patient already started on aggressive IV fluid hydration.  We will continue with aggressive pain management.  I will follow MRCP results for further disposition.  Gae Gallop, MD

## 2020-04-06 ENCOUNTER — Encounter: Payer: Self-pay | Admitting: Internal Medicine

## 2020-04-06 LAB — GLUCOSE, CAPILLARY
Glucose-Capillary: 108 mg/dL — ABNORMAL HIGH (ref 70–99)
Glucose-Capillary: 103 mg/dL — ABNORMAL HIGH (ref 70–99)
Glucose-Capillary: 89 mg/dL (ref 70–99)
Glucose-Capillary: 114 mg/dL — ABNORMAL HIGH (ref 70–99)
Glucose-Capillary: 106 mg/dL — ABNORMAL HIGH (ref 70–99)
Glucose-Capillary: 104 mg/dL — ABNORMAL HIGH (ref 70–99)

## 2020-04-06 LAB — COMPREHENSIVE METABOLIC PANEL
ALT: 662 U/L — ABNORMAL HIGH (ref 0–44)
AST: 266 U/L — ABNORMAL HIGH (ref 15–41)
Albumin: 3.4 g/dL — ABNORMAL LOW (ref 3.5–5.0)
Alkaline Phosphatase: 255 U/L — ABNORMAL HIGH (ref 38–126)
Anion gap: 6 (ref 5–15)
BUN: 15 mg/dL (ref 6–20)
CO2: 25 mmol/L (ref 22–32)
Calcium: 8.3 mg/dL — ABNORMAL LOW (ref 8.9–10.3)
Chloride: 103 mmol/L (ref 98–111)
Creatinine, Ser: 0.79 mg/dL (ref 0.61–1.24)
GFR, Estimated: >60 mL/min (ref >60)
Glucose, Bld: 102 mg/dL — ABNORMAL HIGH (ref 70–99)
Potassium: 3.6 mmol/L (ref 3.5–5.1)
Sodium: 134 mmol/L — ABNORMAL LOW (ref 135–145)
Total Bilirubin: 1.6 mg/dL — ABNORMAL HIGH (ref 0.3–1.2)
Total Protein: 7 g/dL (ref 6.5–8.1)

## 2020-04-06 LAB — CBC
HCT: 39.5 % (ref 39.0–52.0)
Hemoglobin: 13.2 g/dL (ref 13.0–17.0)
MCH: 30.1 pg (ref 26.0–34.0)
MCHC: 33.4 g/dL (ref 30.0–36.0)
MCV: 90 fL (ref 80.0–100.0)
Platelets: 303 10*3/uL (ref 150–400)
RBC: 4.39 MIL/uL (ref 4.22–5.81)
RDW: 13.1 % (ref 11.5–15.5)
WBC: 11.7 10*3/uL — ABNORMAL HIGH (ref 4.0–10.5)
nRBC: 0 % (ref 0.0–0.2)

## 2020-04-06 LAB — LIPASE, BLOOD: Lipase: 309 U/L — ABNORMAL HIGH (ref 11–51)

## 2020-04-06 MED ORDER — INSULIN ASPART 100 UNIT/ML ~~LOC~~ SOLN: 0.0000 [IU] | Freq: Three times a day (TID) | SUBCUTANEOUS | Status: DC

## 2020-04-06 MED ORDER — PNEUMOCOCCAL VAC POLYVALENT 25 MCG/0.5ML IJ INJ
0.5000 mL | INJECTION | INTRAMUSCULAR | Status: AC
  Administered 2020-04-07: 0.5 mL via INTRAMUSCULAR
  Filled 2020-04-06: qty 0.5

## 2020-04-06 MED ORDER — HYDROCODONE-ACETAMINOPHEN 5-325 MG PO TABS
1.0000 | ORAL_TABLET | Freq: Four times a day (QID) | ORAL | Status: DC | PRN
Start: 2020-04-06 — End: 2020-04-09
  Administered 2020-04-06 – 2020-04-09 (×10): 1 via ORAL
  Filled 2020-04-06 (×10): qty 1

## 2020-04-06 NOTE — Progress Notes (Signed)
GI Inpatient Follow-up Note  Subjective:  Patient seen and states he is doing better. Liver enzymes are improving.  Scheduled Inpatient Medications:  . enoxaparin (LOVENOX) injection  40 mg Subcutaneous Q24H  . insulin aspart  0-15 Units Subcutaneous Q4H  . [START ON 04/07/2020] pneumococcal 23 valent vaccine  0.5 mL Intramuscular Tomorrow-1000    Continuous Inpatient Infusions:   . sodium chloride 150 mL/hr at 04/06/20 1102  . piperacillin-tazobactam (ZOSYN)  IV 3.375 g (04/06/20 0644)    PRN Inpatient Medications:  morphine injection, ondansetron **OR** ondansetron (ZOFRAN) IV  Review of Systems:  Review of Systems  Constitutional: Negative for chills and fever.  Eyes: Negative for pain.  Respiratory: Negative for cough.   Cardiovascular: Negative for chest pain.  Gastrointestinal: Positive for abdominal pain. Negative for blood in stool, constipation, diarrhea, melena and nausea.  Musculoskeletal: Negative for joint pain.  Skin: Negative for itching and rash.  Neurological: Negative for focal weakness.  Psychiatric/Behavioral: Negative for substance abuse.  All other systems reviewed and are negative.    Physical Examination: BP 108/70 (BP Location: Left Arm)   Pulse 86   Temp 98.3 F (36.8 C)   Resp 17   Ht 5\' 5"  (1.651 m)   Wt 79.4 kg   SpO2 98%   BMI 29.12 kg/m  Gen: NAD, alert and oriented x 4 HEENT: PEERLA, EOMI, Neck: supple, no JVD or thyromegaly Chest: No respiratory distress Abd: soft, non-tender Ext: no edema, well perfused with 2+ pulses, Skin: no rash or lesions noted Lymph: no LAD  Data: Lab Results  Component Value Date   WBC 11.7 (H) 04/06/2020   HGB 13.2 04/06/2020   HCT 39.5 04/06/2020   MCV 90.0 04/06/2020   PLT 303 04/06/2020   Recent Labs  Lab 04/05/20 0125 04/06/20 0516  HGB 14.5 13.2   Lab Results  Component Value Date   NA 134 (L) 04/06/2020   K 3.6 04/06/2020   CL 103 04/06/2020   CO2 25 04/06/2020   BUN 15  04/06/2020   CREATININE 0.79 04/06/2020   Lab Results  Component Value Date   ALT 662 (H) 04/06/2020   AST 266 (H) 04/06/2020   ALKPHOS 255 (H) 04/06/2020   BILITOT 1.6 (H) 04/06/2020   No results for input(s): APTT, INR, PTT in the last 168 hours. Assessment/Plan: Mr. Findling is a 40 y.o. gentleman with pancreatitis with recent cholecystectomy but no findings of obstruction on MRCP. Liver enzymes improving  Recommendations:  - advance diet as tolerated - continue IV fluids - further management per primary team  We will follow peripherally. Please call with any questions or concerns.  40 MD, MPH [facility]

## 2020-04-06 NOTE — Progress Notes (Signed)
Patient is uninsured. Patient was provided with [facility] referral earlier this month during previous admission. Patient was also provided with diabetic supplies.  Alfonso Ramus, [place] [phone]

## 2020-04-06 NOTE — Progress Notes (Signed)
[facility] SURGICAL PROGRESS NOTE  Hospital Day(s): 1.   Post op day(s):  . 15  Interval History: Patient seen and examined, no acute events or new complaints overnight. Patient reports feeling significantly better, and tolerating clear liquid diet without issue.  He denies pain and reports he like to go home.  I cautioned him that his labs are better, and expect him to get better.  I will liberalize his diet, and see how he does with lunch this afternoon.  Review of Systems:  Constitutional: denies fever, chills  Respiratory: denies any shortness of breath  Cardiovascular: denies chest pain or palpitations  Gastrointestinal: denies abdominal pain, N/V, or diarrhea Musculoskeletal: denies pain, decreased motor or sensation Integumentary: denies any other rashes or skin discolorations  Vital signs in last 24 hours: [min-max] current  Temp:  [98.2 F (36.8 C)-99.5 F (37.5 C)] 99.2 F (37.3 C) (03/19 0803) Pulse Rate:  [65-99] 99 (03/19 0803) Resp:  [16-20] 17 (03/19 0803) BP: (99-122)/(70-90) 108/70 (03/19 0803) SpO2:  [95 %-99 %] 96 % (03/19 0803)     Height: 5\' 5"  (165.1 cm) Weight: 79.4 kg BMI (Calculated): 29.12   Intake/Output last 2 shifts:  03/18 0701 - 03/19 0700 In: 2296.4 [I.V.:2000; IV Piggyback:296.4] Out: 1675 [Urine:1675]   Physical Exam:  Constitutional: alert, cooperative and no distress  Respiratory: breathing non-labored at rest  Cardiovascular: regular rate and sinus rhythm  Gastrointestinal: soft, non-tender, and non-distended Integumentary: Warm and dry.  Labs:  CBC Latest Ref Rng & Units 04/06/2020 04/05/2020 03/23/2020  WBC 4.0 - 10.5 K/uL 11.7(H) 13.4(H) 11.4(H)  Hemoglobin 13.0 - 17.0 g/dL 05/23/2020 80.9 11.7(L)  Hematocrit 39.0 - 52.0 % 39.5 43.4 34.8(L)  Platelets 150 - 400 K/uL 303 375 312   CMP Latest Ref Rng & Units 04/06/2020 04/05/2020 03/23/2020  Glucose 70 - 99 mg/dL 05/23/2020) 382(N) 053(Z)  BUN 6 - 20 mg/dL 15 16 14   Creatinine 0.61  - 1.24 mg/dL 767(H 4.19  Sodium 135 - 145 mmol/L 134(L) 136 133(L)  Potassium 3.5 - 5.1 mmol/L 3.6 4.0 3.6  Chloride 98 - 111 mmol/L 103 100 102  CO2 22 - 32 mmol/L 25 26 22   Calcium 8.9 - 10.3 mg/dL 8.3(L) 9.6 8.2(L)  Total Protein 6.5 - 8.1 g/dL 7.0 3.79) 7.0  Total Bilirubin 0.3 - 1.2 mg/dL 0.24) 2.1(H) 1.7(H)  Alkaline Phos 38 - 126 U/L 255(H) 300(H) 170(H)  AST 15 - 41 U/L 266(H) 984(H) 41  ALT 0 - 44 U/L 662(H) 937(H) 107(H)     Imaging studies: No new pertinent imaging studies   Assessment/Plan: Status post robotic cholecystectomy now postop 15 days.  Likely retained stone that caused transient hyperglycemia/pancreatitis.  LFTs and WBC trending down.   Patient Active Problem List   Diagnosis Date Noted  . Acute pancreatitis 04/05/2020  . Status post laparoscopic cholecystectomy 04/04/2020  . Type 2 diabetes mellitus without complication (HCC) 03/21/2020  . Hyperglycemia 03/20/2020  . Transaminitis 03/20/2020  . Hyponatremia 03/20/2020    -We will liberalize his diet, monitor his clinical status.  -I would like to repeat his labs tomorrow to ensure this trend.  -However, he seems highly motivated to go home.  And I will not keep him captive as I can get outpatient labs next week.  -We will assess later this afternoon to determine the need to keep him another night.  All of the above findings and recommendations were discussed with the patient, and the medical team, and all of patient's  questions were answered to his expressed satisfaction.  -- Campbell Lerner, M.D., [facility] 04/06/2020

## 2020-04-06 NOTE — Progress Notes (Signed)
PROGRESS NOTE    Matthew Stevenson  GLO:[id number] DOB: [birth year] DOA: 04/05/2020 PCP: Patient, No Pcp Per  155A/155A-AA   Assessment & Plan:   Principal Problem:   Acute pancreatitis Active Problems:   Transaminitis   Type 2 diabetes mellitus without complication (HCC)  Matthew Stevenson is a 40 y.o. male with medical history significant for recent laparoscopic cholecystectomy for acute gallstone pancreatitis which was done 2 weeks ago, status post drain placement and removal on 04/04/20 and diabetes mellitus who presents to the emergency room for evaluation of sudden onset abdominal pain which was mostly in the epigastrium and peri umbilical area. He rated his pain a 10/10 in intensity at its worst, and pain was associated with nausea and multiple episodes of vomiting.   Acute pancreatitis, POA In a patient who is status post recent laparoscopic cholecystectomy for gallstone pancreatitis He presents for evaluation of abdominal pain and has elevated lipase levels (6933-->309 next morning already) and transaminitis.   Imaging does not show evidence of a common bile duct stone but is concerning for possible cholangitis --Pt started on Zosyn.  Flagyl for possible liver abscess noted on CT scan --GI and surgery consulted Plan: --cont zosyn --d/c flagyl --increase MIVF to 150 ml/hr --diet advanced to regular by surgery --d/c IV morphine and order Norco for pain  Diabetes mellitus, poorly controlled --A1c 8.5.  Takes metformin at home. --SSI TID while inpatient --Hold home metformin    DVT prophylaxis: Lovenox SQ Code Status: Full code  Family Communication: brother updated at bedside today Level of care: Med-Surg Dispo:   The patient is from: home Anticipated d/c is to: home Anticipated d/c date is: 1-2 days Patient currently is not medically ready to d/c due to: need to be on IVF for pancreatitis   Subjective and Interval History:  Pt reported abdominal pain as  mild, no N/V/D.   Tolerating clear liquid.     Objective: Vitals:   04/05/20 2334 04/06/20 0803 04/06/20 1121 04/06/20 1556  BP: 113/73 108/70 108/70 102/69  Pulse: 84 99 86 83  Resp: 18 17 17 17   Temp: 98.2 F (36.8 C) 99.2 F (37.3 C) 98.3 F (36.8 C) 98.6 F (37 C)  TempSrc:      SpO2: 98% 96% 98% 97%  Weight:      Height:        Intake/Output Summary (Last 24 hours) at 04/06/2020 1741 Last data filed at 04/06/2020 0948 Gross per 24 hour  Intake 2735.11 ml  Output 600 ml  Net 2135.11 ml   Filed Weights   04/05/20 0117  Weight: 79.4 kg    Examination:   Constitutional: NAD, AAOx3, standing HEENT: conjunctivae and lids normal, EOMI CV: No cyanosis.   RESP: normal respiratory effort, on RA Extremities: No effusions, edema in BLE SKIN: warm, dry Neuro: II - XII grossly intact.   Psych: Normal mood and affect.  Appropriate judgement and reason   Data Reviewed: I have personally reviewed following labs and imaging studies  CBC: Recent Labs  Lab 04/05/20 0125 04/06/20 0516  WBC 13.4* 11.7*  HGB 14.5 13.2  HCT 43.4 39.5  MCV 89.5 90.0  PLT 375 303   Basic Metabolic Panel: Recent Labs  Lab 04/05/20 0125 04/06/20 0516  NA 136 134*  K 4.0 3.6  CL 100 103  CO2 26 25  GLUCOSE 223* 102*  BUN 16 15  CREATININE 0.80 0.79  CALCIUM 9.6 8.3*   GFR: Estimated Creatinine Clearance: 119.3 mL/min (by C-G  formula based on SCr of 0.79 mg/dL). Liver Function Tests: Recent Labs  Lab 04/05/20 0125 04/06/20 0516  AST 984* 266*  ALT 937* 662*  ALKPHOS 300* 255*  BILITOT 2.1* 1.6*  PROT 8.7* 7.0  ALBUMIN 4.3 3.4*   Recent Labs  Lab 04/05/20 0125 04/06/20 0516  LIPASE 6,933* 309*   No results for input(s): AMMONIA in the last 168 hours. Coagulation Profile: No results for input(s): INR, PROTIME in the last 168 hours. Cardiac Enzymes: No results for input(s): CKTOTAL, CKMB, CKMBINDEX, TROPONINI in the last 168 hours. BNP (last 3 results) No results for  input(s): PROBNP in the last 8760 hours. HbA1C: No results for input(s): HGBA1C in the last 72 hours. CBG: Recent Labs  Lab 04/06/20 0103 04/06/20 0429 04/06/20 0802 04/06/20 1121 04/06/20 1606  GLUCAP 108* 103* 89 114* 106*   Lipid Profile: No results for input(s): CHOL, HDL, LDLCALC, TRIG, CHOLHDL, LDLDIRECT in the last 72 hours. Thyroid Function Tests: No results for input(s): TSH, T4TOTAL, FREET4, T3FREE, THYROIDAB in the last 72 hours. Anemia Panel: No results for input(s): VITAMINB12, FOLATE, FERRITIN, TIBC, IRON, RETICCTPCT in the last 72 hours. Sepsis Labs: Recent Labs  Lab 04/05/20 0400 04/05/20 0610  PROCALCITON 0.14  --   LATICACIDVEN 2.3* 2.0*    Recent Results (from the past 240 hour(s))  Resp Panel by RT-PCR (Flu A&B, Covid) Nasopharyngeal Swab     Status: None   Collection Time: 04/05/20  8:02 AM   Specimen: Nasopharyngeal Swab; Nasopharyngeal(NP) swabs in vial transport medium  Result Value Ref Range Status   SARS Coronavirus 2 by RT PCR NEGATIVE NEGATIVE Final    Comment: (NOTE) SARS-CoV-2 target nucleic acids are NOT DETECTED.  The SARS-CoV-2 RNA is generally detectable in upper respiratory specimens during the acute phase of infection. The lowest concentration of SARS-CoV-2 viral copies this assay can detect is 138 copies/mL. A negative result does not preclude SARS-Cov-2 infection and should not be used as the sole basis for treatment or other patient management decisions. A negative result may occur with  improper specimen collection/handling, submission of specimen other than nasopharyngeal swab, presence of viral mutation(s) within the areas targeted by this assay, and inadequate number of viral copies(<138 copies/mL). A negative result must be combined with clinical observations, patient history, and epidemiological information. The expected result is Negative.  Fact Sheet for Patients:  BloggerCourse.comhttps://www.fda.gov/media/152166/download  Fact Sheet  for Healthcare Providers:  SeriousBroker.ithttps://www.fda.gov/media/152162/download  This test is no t yet approved or cleared by the [place] FDA and  has been authorized for detection and/or diagnosis of SARS-CoV-2 by FDA under an Emergency Use Authorization (EUA). This EUA will remain  in effect (meaning this test can be used) for the duration of the COVID-19 declaration under Section 564(b)(1) of the Act, 21 U.S.C.section 360bbb-3(b)(1), unless the authorization is terminated  or revoked sooner.       Influenza A by PCR NEGATIVE NEGATIVE Final   Influenza B by PCR NEGATIVE NEGATIVE Final    Comment: (NOTE) The Xpert Xpress SARS-CoV-2/FLU/RSV plus assay is intended as an aid in the diagnosis of influenza from Nasopharyngeal swab specimens and should not be used as a sole basis for treatment. Nasal washings and aspirates are unacceptable for Xpert Xpress SARS-CoV-2/FLU/RSV testing.  Fact Sheet for Patients: BloggerCourse.comhttps://www.fda.gov/media/152166/download  Fact Sheet for Healthcare Providers: SeriousBroker.ithttps://www.fda.gov/media/152162/download  This test is not yet approved or cleared by the [place] FDA and has been authorized for detection and/or diagnosis of SARS-CoV-2 by FDA under an Emergency Use  Authorization (EUA). This EUA will remain in effect (meaning this test can be used) for the duration of the COVID-19 declaration under Section 564(b)(1) of the Act, 21 U.S.C. section 360bbb-3(b)(1), unless the authorization is terminated or revoked.  Performed at [facility], [address] [place], [place] [address]   Blood culture (routine x 2)     Status: None (Preliminary result)   Collection Time: 04/05/20  9:30 AM   Specimen: BLOOD  Result Value Ref Range Status   Specimen Description BLOOD  LEFT AC  Final   Special Requests   Final    BOTTLES DRAWN AEROBIC AND ANAEROBIC Blood Culture adequate volume   Culture   Final    NO GROWTH < 24 HOURS Performed at [facility], [address], [place], [place] [address]    Report Status PENDING  Incomplete  Blood culture (routine x 2)     Status: None (Preliminary result)   Collection Time: 04/05/20  9:40 AM   Specimen: BLOOD  Result Value Ref Range Status   Specimen Description BLOOD  LEFT HAND  Final   Special Requests   Final    BOTTLES DRAWN AEROBIC AND ANAEROBIC Blood Culture results may not be optimal due to an excessive volume of blood received in culture bottles   Culture   Final    NO GROWTH < 24 HOURS Performed at [facility], [address] [place], [place] [address]    Report Status PENDING  Incomplete      Radiology Studies: CT ABDOMEN PELVIS W CONTRAST  Result Date: 04/05/2020 CLINICAL DATA:  40 year old male with abdominal pain, nausea vomiting. Laparoscopic cholecystectomy 2 weeks ago with postoperative drain removed yesterday. EXAM: CT ABDOMEN AND PELVIS WITH CONTRAST TECHNIQUE: Multidetector CT imaging of the abdomen and pelvis was performed using the standard protocol following bolus administration of intravenous contrast. CONTRAST:  OMNIPAQUE IOHEXOL 300 MG/ML  SOLN COMPARISON:  CT Abdomen and Pelvis 03/20/2020, MRI and MRCP that day. FINDINGS: Lower chest: Partially visible right middle lobe ground-glass opacity (series 4, image 3 today) is unchanged. Therefore favor scarring over acute pulmonary abnormality. Stable cardiac size at the upper limits of normal. No pericardial or pleural effusion. Visible lower lobes are clear. Hepatobiliary: Absent gallbladder now with mild hyperenhancement of the residual cystic duct (coronal image 41). Small 17 x 17 x 24 mm (AP by transverse by CC) area of heterogeneous hypodensity of the liver parenchyma at the gallbladder fossa (coronal image 36). CBD measures 8-9 mm diameter, but there is no intrahepatic biliary ductal dilatation. No filling defect identified within the CBD which seems to taper to the duodenum. No drainable fluid.  Pancreas: New peripancreatic inflammatory stranding in the lesser sac centered at the pancreatic body. No pancreatic ductal dilatation or necrosis. Spleen: Negative. Adrenals/Urinary Tract: Stable and negative. Symmetric renal enhancement and contrast excretion with no hydronephrosis or hydroureter. Stomach/Bowel: Negative large bowel and appendix (series 2, image 61). No dilated small bowel. Negative terminal ileum. Inflammation at the root of the small bowel mesentery mostly affecting the duodenum which is decompressed. Lesser involvement of the distal stomach, proximal jejunum. No free air. Postoperative changes to the ventral abdominal wall since the prior CT, including a subtle tract from the ventral abdomen to the gallbladder fossa probably related to the removed drain (coronal image 28). Vascular/Lymphatic: Major arterial structures are patent with no atherosclerosis identified. Portal venous system is patent. Small reactive appearing upper abdominal lymph nodes are not significantly changed. Reproductive: Negative.  Other: Trace free fluid in the pelvis suspected on series 2, image 81. Musculoskeletal: Stable.  No acute osseous abnormality identified. IMPRESSION: 1. Status post cholecystectomy with evidence of Acute Pancreatitis. Inflammation in the lesser sac contiguous with the gallbladder fossa (see #2). Secondary inflammation of the duodenum and root of the small bowel mesentery. 2. Small new 2.4 cm area of heterogeneous liver parenchyma adjacent to the gallbladder fossa is likely inflammatory in this setting and suspicious for a developing small liver abscess (coronal image 36). But no drainable fluid collection at this time. Trace free fluid in the pelvis. 3. Hyperenhancement of the residual cystic duct. Increased CBD size, although favor postoperative in nature given absence of intrahepatic ductal dilatation. 4. Postoperative changes to the ventral abdomen including granulation tissue along a drainage  tract to the gallbladder fossa. 5. Partially visible right middle lobe pulmonary ground-glass opacity is stable since March and favor scarring over acute pulmonary abnormality. Electronically Signed   By: Odessa Fleming M.D.   On: 04/05/2020 04:59   MR 3D Recon At Scanner  Result Date: 04/05/2020 CLINICAL DATA:  Acute pancreatitis. History of recent cholecystectomy. EXAM: MRI ABDOMEN WITH CONTRAST (WITH MRCP) TECHNIQUE: Multiplanar multisequence MR imaging of the abdomen was performed following the administration of intravenous contrast. Heavily T2-weighted images of the biliary and pancreatic ducts were obtained, and three-dimensional MRCP images were rendered by post processing. CONTRAST:  30mL GADAVIST GADOBUTROL 1 MMOL/ML IV SOLN COMPARISON:  CT scan, same date. FINDINGS: Lower chest: The lung bases are clear of an acute process. No pulmonary lesions or pleural effusion. No pericardial effusion. Hepatobiliary: No hepatic lesions or intrahepatic biliary dilatation. The gallbladder is surgically absent. No complicating features such as bile leak or abscess. Small not unexpected postoperative fluid collection in the gallbladder fossa. Normal caliber and course of the common bile duct. There is mild diffuse inflammation of common bile duct likely due to the surrounding pancreatitis. Cholangitis would be another possibility. No evidence of enhancement of intrahepatic ducts. No findings for common bile duct stones. Pancreas: MR findings consistent with acute interstitial pancreatitis. Pancreas is mildly enlarged and diffusely inflamed with peripancreatic inflammation and fluid. No findings for pancreatic necrosis. Normal main pancreatic duct. Spleen:  Normal size.  No focal lesions. Adrenals/Urinary Tract: The adrenal glands kidneys are unremarkable. Stomach/Bowel: There appears to be mild diffuse inflammation of the colon possibly inflammatory or infectious colitis. There is also moderate inflammation of the duodenum due  to the surrounding pancreatitis. Vascular/Lymphatic: The aorta and branch vessels are normal. The major venous structures are patent. There are small scattered upper abdominal lymph nodes which are likely reactive. Other: No focal abdominal fluid collections. No abdominal wall hernia. Musculoskeletal: No significant bony findings. IMPRESSION: 1. MR findings consistent with acute interstitial pancreatitis. No findings for pancreatic necrosis. 2. Mild diffuse inflammation and enhancement of the common bile duct likely due to the surrounding pancreatitis. Cholangitis would be another possibility. No obstructing common bile duct stones are identified. 3. Status post cholecystectomy. No complicating features such as bile leak or abscess. Small not unexpected postop fluid collection in the gallbladder fossa. 4. Mild diffuse inflammation of the colon possibly inflammatory or infectious colitis. Electronically Signed   By: Rudie Meyer M.D.   On: 04/05/2020 08:00   MR ABDOMEN MRCP W WO CONTAST  Result Date: 04/05/2020 CLINICAL DATA:  Acute pancreatitis. History of recent cholecystectomy. EXAM: MRI ABDOMEN WITH CONTRAST (WITH MRCP) TECHNIQUE: Multiplanar multisequence MR imaging of the abdomen was performed following the administration of  intravenous contrast. Heavily T2-weighted images of the biliary and pancreatic ducts were obtained, and three-dimensional MRCP images were rendered by post processing. CONTRAST:  8mL GADAVIST GADOBUTROL 1 MMOL/ML IV SOLN COMPARISON:  CT scan, same date. FINDINGS: Lower chest: The lung bases are clear of an acute process. No pulmonary lesions or pleural effusion. No pericardial effusion. Hepatobiliary: No hepatic lesions or intrahepatic biliary dilatation. The gallbladder is surgically absent. No complicating features such as bile leak or abscess. Small not unexpected postoperative fluid collection in the gallbladder fossa. Normal caliber and course of the common bile duct. There is  mild diffuse inflammation of common bile duct likely due to the surrounding pancreatitis. Cholangitis would be another possibility. No evidence of enhancement of intrahepatic ducts. No findings for common bile duct stones. Pancreas: MR findings consistent with acute interstitial pancreatitis. Pancreas is mildly enlarged and diffusely inflamed with peripancreatic inflammation and fluid. No findings for pancreatic necrosis. Normal main pancreatic duct. Spleen:  Normal size.  No focal lesions. Adrenals/Urinary Tract: The adrenal glands kidneys are unremarkable. Stomach/Bowel: There appears to be mild diffuse inflammation of the colon possibly inflammatory or infectious colitis. There is also moderate inflammation of the duodenum due to the surrounding pancreatitis. Vascular/Lymphatic: The aorta and branch vessels are normal. The major venous structures are patent. There are small scattered upper abdominal lymph nodes which are likely reactive. Other: No focal abdominal fluid collections. No abdominal wall hernia. Musculoskeletal: No significant bony findings. IMPRESSION: 1. MR findings consistent with acute interstitial pancreatitis. No findings for pancreatic necrosis. 2. Mild diffuse inflammation and enhancement of the common bile duct likely due to the surrounding pancreatitis. Cholangitis would be another possibility. No obstructing common bile duct stones are identified. 3. Status post cholecystectomy. No complicating features such as bile leak or abscess. Small not unexpected postop fluid collection in the gallbladder fossa. 4. Mild diffuse inflammation of the colon possibly inflammatory or infectious colitis. Electronically Signed   By: Rudie Meyer M.D.   On: 04/05/2020 08:00     Scheduled Meds: . enoxaparin (LOVENOX) injection  40 mg Subcutaneous Q24H  . insulin aspart  0-15 Units Subcutaneous Q4H  . [START ON 04/07/2020] pneumococcal 23 valent vaccine  0.5 mL Intramuscular Tomorrow-1000   Continuous  Infusions: . sodium chloride 150 mL/hr at 04/06/20 1102  . piperacillin-tazobactam (ZOSYN)  IV 3.375 g (04/06/20 1314)     LOS: 1 day     Darlin Priestly, MD [facility] If 7PM-7AM, please contact night-coverage 04/06/2020, 5:41 PM

## 2020-04-07 LAB — GLUCOSE, CAPILLARY
Glucose-Capillary: 132 mg/dL — ABNORMAL HIGH (ref 70–99)
Glucose-Capillary: 136 mg/dL — ABNORMAL HIGH (ref 70–99)
Glucose-Capillary: 93 mg/dL (ref 70–99)
Glucose-Capillary: 97 mg/dL (ref 70–99)
Glucose-Capillary: 97 mg/dL (ref 70–99)
Glucose-Capillary: 99 mg/dL (ref 70–99)

## 2020-04-07 LAB — BASIC METABOLIC PANEL
Anion gap: 8 (ref 5–15)
BUN: 9 mg/dL (ref 6–20)
CO2: 24 mmol/L (ref 22–32)
Calcium: 8.2 mg/dL — ABNORMAL LOW (ref 8.9–10.3)
Chloride: 102 mmol/L (ref 98–111)
Creatinine, Ser: 0.6 mg/dL — ABNORMAL LOW (ref 0.61–1.24)
GFR, Estimated: >60 mL/min (ref >60)
Glucose, Bld: 105 mg/dL — ABNORMAL HIGH (ref 70–99)
Potassium: 3.4 mmol/L — ABNORMAL LOW (ref 3.5–5.1)
Sodium: 134 mmol/L — ABNORMAL LOW (ref 135–145)

## 2020-04-07 LAB — CBC
HCT: 37.7 % — ABNORMAL LOW (ref 39.0–52.0)
Hemoglobin: 12.8 g/dL — ABNORMAL LOW (ref 13.0–17.0)
MCH: 30.2 pg (ref 26.0–34.0)
MCHC: 34 g/dL (ref 30.0–36.0)
MCV: 88.9 fL (ref 80.0–100.0)
Platelets: 256 10*3/uL (ref 150–400)
RBC: 4.24 MIL/uL (ref 4.22–5.81)
RDW: 13 % (ref 11.5–15.5)
WBC: 12.5 10*3/uL — ABNORMAL HIGH (ref 4.0–10.5)
nRBC: 0 % (ref 0.0–0.2)

## 2020-04-07 LAB — HEPATIC FUNCTION PANEL
ALT: 369 U/L — ABNORMAL HIGH (ref 0–44)
AST: 81 U/L — ABNORMAL HIGH (ref 15–41)
Albumin: 3.1 g/dL — ABNORMAL LOW (ref 3.5–5.0)
Alkaline Phosphatase: 199 U/L — ABNORMAL HIGH (ref 38–126)
Bilirubin, Direct: 0.4 mg/dL — ABNORMAL HIGH (ref 0.0–0.2)
Indirect Bilirubin: 1 mg/dL — ABNORMAL HIGH (ref 0.3–0.9)
Total Bilirubin: 1.4 mg/dL — ABNORMAL HIGH (ref 0.3–1.2)
Total Protein: 6.8 g/dL (ref 6.5–8.1)

## 2020-04-07 LAB — LIPASE, BLOOD: Lipase: 69 U/L — ABNORMAL HIGH (ref 11–51)

## 2020-04-07 LAB — LACTIC ACID, PLASMA: Lactic Acid, Venous: 1.3 mmol/L (ref 0.5–1.9)

## 2020-04-07 LAB — MAGNESIUM: Magnesium: 2.1 mg/dL (ref 1.7–2.4)

## 2020-04-07 MED ORDER — POTASSIUM CHLORIDE 20 MEQ PO PACK
40.0000 meq | PACK | Freq: Once | ORAL | Status: AC
  Administered 2020-04-07: 40 meq via ORAL
  Filled 2020-04-07: qty 2

## 2020-04-07 NOTE — Progress Notes (Addendum)
PROGRESS NOTE    Matthew Stevenson  ZOX:[id number] DOB: February 02, 1980 DOA: 04/05/2020 PCP: Patient, No Pcp Per  155A/155A-AA   Assessment & Plan:   Principal Problem:   Acute pancreatitis Active Problems:   Transaminitis   Type 2 diabetes mellitus without complication (HCC)  Matthew Stevenson is a 40 y.o. male with medical history significant for recent laparoscopic cholecystectomy for acute gallstone pancreatitis which was done 2 weeks ago, status post drain placement and removal on 04/04/20 and diabetes mellitus who presents to the emergency room for evaluation of sudden onset abdominal pain which was mostly in the epigastrium and peri umbilical area. He rated his pain a 10/10 in intensity at its worst, and pain was associated with nausea and multiple episodes of vomiting.   Acute pancreatitis, POA In a patient who is status post recent laparoscopic cholecystectomy for gallstone pancreatitis He presents for evaluation of abdominal pain and has elevated lipase levels (6933-->309 next morning already) and transaminitis.   Imaging does not show evidence of a common bile duct stone but is concerning for possible cholangitis --Pt started on Zosyn.  Flagyl for possible liver abscess noted on CT scan, which was d/c'ed since zosyn covers. --GI and surgery consulted --procal 0.14 Plan: --cont zosyn for empiric tx for now --cont MIVF@150  --back on clear liquid diet due to abdominal pain --Norco for pain, no need for IV pain meds  Elevated LFT's --2/2 pancreatitis, trending down now --trend LFT's  Diabetes mellitus, poorly controlled --A1c 8.5.  Takes metformin at home. --SSI TID while inpatient --Hold home metformin    DVT prophylaxis: Lovenox SQ Code Status: Full code  Family Communication: brother updated at bedside today Level of care: Med-Surg Dispo:   The patient is from: home Anticipated d/c is to: home Anticipated d/c date is: 1-2 days Patient currently is not  medically ready to d/c due to: need to be on IVF for pancreatitis, and not tolerating regular diet yet.   Subjective and Interval History:  Diet advanced to regular by surgery yesterday.  RN reported pt had abdominal pain after a few bites of dinner.  Diet changed back to clear liquid today.  Pt reported continued abdominal pain, though controlled.  No N/V/D.   Objective: Vitals:   04/07/20 0001 04/07/20 0402 04/07/20 0752 04/07/20 1147  BP: 96/72 112/68 110/73 117/75  Pulse: 85 82 79 84  Resp: 16 16 16 18   Temp: 98.1 F (36.7 C) 98.6 F (37 C) 98.5 F (36.9 C) 98.2 F (36.8 C)  TempSrc: Oral Oral    SpO2: 94% 97% 98% 96%  Weight:      Height:        Intake/Output Summary (Last 24 hours) at 04/07/2020 1453 Last data filed at 04/07/2020 0737 Gross per 24 hour  Intake 2398.52 ml  Output 950 ml  Net 1448.52 ml   Filed Weights   04/05/20 0117  Weight: 79.4 kg    Examination:   Constitutional: NAD, AAOx3 HEENT: conjunctivae and lids normal, EOMI CV: No cyanosis.   RESP: normal respiratory effort, on RA Extremities: No effusions, edema in BLE SKIN: warm, dry Neuro: II - XII grossly intact.   Psych: Normal mood and affect.  Appropriate judgement and reason   Data Reviewed: I have personally reviewed following labs and imaging studies  CBC: Recent Labs  Lab 04/05/20 0125 04/06/20 0516 04/07/20 0555  WBC 13.4* 11.7* 12.5*  HGB 14.5 13.2 12.8*  HCT 43.4 39.5 37.7*  MCV 89.5 90.0 88.9  PLT  375 303 256   Basic Metabolic Panel: Recent Labs  Lab 04/05/20 0125 04/06/20 0516 04/07/20 0555  NA 136 134* 134*  K 4.0 3.6 3.4*  CL 100 103 102  CO2 26 25 24   GLUCOSE 223* 102* 105*  BUN 16 15 9   CREATININE 0.80 0.79 0.60*  CALCIUM 9.6 8.3* 8.2*  MG  --   --  2.1   GFR: Estimated Creatinine Clearance: 119.3 mL/min (A) (by C-G formula based on SCr of 0.6 mg/dL (L)). Liver Function Tests: Recent Labs  Lab 04/05/20 0125 04/06/20 0516 04/07/20 0555  AST 984*  266* 81*  ALT 937* 662* 369*  ALKPHOS 300* 255* 199*  BILITOT 2.1* 1.6* 1.4*  PROT 8.7* 7.0 6.8  ALBUMIN 4.3 3.4* 3.1*   Recent Labs  Lab 04/05/20 0125 04/06/20 0516 04/07/20 0550  LIPASE 6,933* 309* 69*   No results for input(s): AMMONIA in the last 168 hours. Coagulation Profile: No results for input(s): INR, PROTIME in the last 168 hours. Cardiac Enzymes: No results for input(s): CKTOTAL, CKMB, CKMBINDEX, TROPONINI in the last 168 hours. BNP (last 3 results) No results for input(s): PROBNP in the last 8760 hours. HbA1C: No results for input(s): HGBA1C in the last 72 hours. CBG: Recent Labs  Lab 04/06/20 2057 04/07/20 0003 04/07/20 0359 04/07/20 0751 04/07/20 1146  GLUCAP 104* 97 99 97 132*   Lipid Profile: No results for input(s): CHOL, HDL, LDLCALC, TRIG, CHOLHDL, LDLDIRECT in the last 72 hours. Thyroid Function Tests: No results for input(s): TSH, T4TOTAL, FREET4, T3FREE, THYROIDAB in the last 72 hours. Anemia Panel: No results for input(s): VITAMINB12, FOLATE, FERRITIN, TIBC, IRON, RETICCTPCT in the last 72 hours. Sepsis Labs: Recent Labs  Lab 04/05/20 0400 04/05/20 0610 04/07/20 0909  PROCALCITON 0.14  --   --   LATICACIDVEN 2.3* 2.0* 1.3    Recent Results (from the past 240 hour(s))  Resp Panel by RT-PCR (Flu A&B, Covid) Nasopharyngeal Swab     Status: None   Collection Time: 04/05/20  8:02 AM   Specimen: Nasopharyngeal Swab; Nasopharyngeal(NP) swabs in vial transport medium  Result Value Ref Range Status   SARS Coronavirus 2 by RT PCR NEGATIVE NEGATIVE Final    Comment: (NOTE) SARS-CoV-2 target nucleic acids are NOT DETECTED.  The SARS-CoV-2 RNA is generally detectable in upper respiratory specimens during the acute phase of infection. The lowest concentration of SARS-CoV-2 viral copies this assay can detect is 138 copies/mL. A negative result does not preclude SARS-Cov-2 infection and should not be used as the sole basis for treatment or other  patient management decisions. A negative result may occur with  improper specimen collection/handling, submission of specimen other than nasopharyngeal swab, presence of viral mutation(s) within the areas targeted by this assay, and inadequate number of viral copies(<138 copies/mL). A negative result must be combined with clinical observations, patient history, and epidemiological information. The expected result is Negative.  Fact Sheet for Patients:  04/09/20  Fact Sheet for Healthcare Providers:  04/07/20  This test is no t yet approved or cleared by the [employer] FDA and  has been authorized for detection and/or diagnosis of SARS-CoV-2 by FDA under an Emergency Use Authorization (EUA). This EUA will remain  in effect (meaning this test can be used) for the duration of the COVID-19 declaration under Section 564(b)(1) of the Act, 21 U.S.C.section 360bbb-3(b)(1), unless the authorization is terminated  or revoked sooner.       Influenza A by PCR NEGATIVE NEGATIVE Final   Influenza  B by PCR NEGATIVE NEGATIVE Final    Comment: (NOTE) The Xpert Xpress SARS-CoV-2/FLU/RSV plus assay is intended as an aid in the diagnosis of influenza from Nasopharyngeal swab specimens and should not be used as a sole basis for treatment. Nasal washings and aspirates are unacceptable for Xpert Xpress SARS-CoV-2/FLU/RSV testing.  Fact Sheet for Patients: [employer]  Fact Sheet for Healthcare Providers: [employer]it  This test is not yet approved or cleared by the [place] FDA and has been authorized for detection and/or diagnosis of SARS-CoV-2 by FDA under an Emergency Use Authorization (EUA). This EUA will remain in effect (meaning this test can be used) for the duration of the COVID-19 declaration under Section 564(b)(1) of the Act, 21 U.S.C. section  360bbb-3(b)(1), unless the authorization is terminated or revoked.  Performed at [facility], [address] [place], [place] [address]   Blood culture (routine x 2)     Status: None (Preliminary result)   Collection Time: 04/05/20  9:30 AM   Specimen: BLOOD  Result Value Ref Range Status   Specimen Description BLOOD  LEFT [facility]  Final   Special Requests   Final    BOTTLES DRAWN AEROBIC AND ANAEROBIC Blood Culture adequate volume   Culture   Final    NO GROWTH 2 DAYS Performed at [facility], [address], [place], [place] [address]    Report Status PENDING  Incomplete  Blood culture (routine x 2)     Status: None (Preliminary result)   Collection Time: 04/05/20  9:40 AM   Specimen: BLOOD  Result Value Ref Range Status   Specimen Description BLOOD  LEFT HAND  Final   Special Requests   Final    BOTTLES DRAWN AEROBIC AND ANAEROBIC Blood Culture results may not be optimal due to an excessive volume of blood received in culture bottles   Culture   Final    NO GROWTH 2 DAYS Performed at [facility], [address] [place], [place] [address]    Report Status PENDING  Incomplete      Radiology Studies: No results found.   Scheduled Meds: . enoxaparin (LOVENOX) injection  40 mg Subcutaneous Q24H  . insulin aspart  0-15 Units Subcutaneous TID WC   Continuous Infusions: . sodium chloride 150 mL/hr at 04/07/20 0434  . piperacillin-tazobactam (ZOSYN)  IV 3.375 g (04/07/20 1443)     LOS: 2 days     Darlin Priestly, MD [facility] If 7PM-7AM, please contact night-coverage 04/07/2020, 2:53 PM

## 2020-04-07 NOTE — Progress Notes (Signed)
GI Inpatient Follow-up Note  Subjective:  Patient with worsening abdominal pain last night with solids. Now back on liquids.  Scheduled Inpatient Medications:  . enoxaparin (LOVENOX) injection  40 mg Subcutaneous Q24H  . insulin aspart  0-15 Units Subcutaneous TID WC    Continuous Inpatient Infusions:   . sodium chloride 150 mL/hr at 04/07/20 0434  . piperacillin-tazobactam (ZOSYN)  IV 3.375 g (04/07/20 0438)    PRN Inpatient Medications:  HYDROcodone-acetaminophen, ondansetron **OR** ondansetron (ZOFRAN) IV  Review of Systems:  Review of Systems  Constitutional: Negative for chills and fever.  Cardiovascular: Negative for chest pain.  Gastrointestinal: Positive for abdominal pain. Negative for blood in stool, diarrhea and melena.  Musculoskeletal: Negative for joint pain.  Skin: Negative for itching and rash.  Neurological: Negative for focal weakness.  Psychiatric/Behavioral: Negative for substance abuse.  All other systems reviewed and are negative.    Physical Examination: BP 110/73 (BP Location: Left Arm)   Pulse 79   Temp 98.5 F (36.9 C)   Resp 16   Ht 5\' 5"  (1.651 m)   Wt 79.4 kg   SpO2 98%   BMI 29.12 kg/m  Gen: NAD, alert and oriented x 4 HEENT: PEERLA, EOMI, Neck: supple, no JVD or thyromegaly Chest: No respiratory distress Abd: soft, non-distended, non-tender Ext: no edema, well perfused with 2+ pulses, Skin: no rash or lesions noted Lymph: no LAD  Data: Lab Results  Component Value Date   WBC 12.5 (H) 04/07/2020   HGB 12.8 (L) 04/07/2020   HCT 37.7 (L) 04/07/2020   MCV 88.9 04/07/2020   PLT 256 04/07/2020   Recent Labs  Lab 04/05/20 0125 04/06/20 0516 04/07/20 0555  HGB 14.5 13.2 12.8*   Lab Results  Component Value Date   NA 134 (L) 04/07/2020   K 3.4 (L) 04/07/2020   CL 102 04/07/2020   CO2 24 04/07/2020   BUN 9 04/07/2020   CREATININE 0.60 (L) 04/07/2020   Lab Results  Component Value Date   ALT 662 (H) 04/06/2020   AST  266 (H) 04/06/2020   ALKPHOS 255 (H) 04/06/2020   BILITOT 1.6 (H) 04/06/2020   No results for input(s): APTT, INR, PTT in the last 168 hours. Assessment/Plan: Mr. Pertuit is a 40 y.o. gentleman with pancreatitis with recent cholecystectomy but no findings of obstruction on MRCP. Liver enzymes improving  Recommendations:  - recheck liver enzymes today to make sure improving - advance diet as tolerated - continue IV fluids - further management per primary team    40 MD, MPH [facility]

## 2020-04-07 NOTE — Progress Notes (Signed)
[facility] SURGICAL PROGRESS NOTE  Hospital Day(s): 2.   Post op day(s):  . 16  Interval History: Patient seen and examined, no acute events or new complaints overnight.  Had worse pain on solid foods yesterday.  Patient reports feeling better, and back on clear liquid diet again so far without issue.  Reports anorexia, sipping only some of the broth.  He reports the pain is primarily in his back.  Denies any anterior abdominal pain.   Review of Systems:  Constitutional: denies fever, chills  Respiratory: denies any shortness of breath  Cardiovascular: denies chest pain or palpitations  Gastrointestinal: denies abdominal pain, N/V, or diarrhea Musculoskeletal: denies pain, decreased motor or sensation Integumentary: denies any other rashes or skin discolorations  Vital signs in last 24 hours: [min-max] current  Temp:  [98.1 F (36.7 C)-99.6 F (37.6 C)] 98.2 F (36.8 C) (03/20 1147) Pulse Rate:  [79-89] 84 (03/20 1147) Resp:  [16-18] 18 (03/20 1147) BP: (96-117)/(68-76) 117/75 (03/20 1147) SpO2:  [94 %-98 %] 96 % (03/20 1147)     Height: 5\' 5"  (165.1 cm) Weight: 79.4 kg BMI (Calculated): 29.12   Intake/Output last 2 shifts:  03/19 0701 - 03/20 0700 In: 2663.1 [I.V.:2396.2; IV Piggyback:266.8] Out: 950 [Urine:950]   Physical Exam:  Constitutional: alert, cooperative and no distress  Respiratory: breathing non-labored at rest  Cardiovascular: regular rate and sinus rhythm  Gastrointestinal: soft, non-tender, and non-distended, epigastric fullness without guarding. Integumentary: Warm and dry.  Labs:  CBC Latest Ref Rng & Units 04/07/2020 04/06/2020 04/05/2020  WBC 4.0 - 10.5 K/uL 12.5(H) 11.7(H) 13.4(H)  Hemoglobin 13.0 - 17.0 g/dL 12.8(L) 13.2 14.5  Hematocrit 39.0 - 52.0 % 37.7(L) 39.5 43.4  Platelets 150 - 400 K/uL 256 303 375   CMP Latest Ref Rng & Units 04/07/2020 04/06/2020 04/05/2020  Glucose 70 - 99 mg/dL 04/07/2020) 921(J) 941(D)  BUN 6 - 20 mg/dL 9 15 16    Creatinine 0.61 - 1.24 mg/dL 408(X) 4.48(J  Sodium 135 - 145 mmol/L 134(L) 134(L) 136  Potassium 3.5 - 5.1 mmol/L 3.4(L) 3.6 4.0  Chloride 98 - 111 mmol/L 102 103 100  CO2 22 - 32 mmol/L 24 25 26   Calcium 8.9 - 10.3 mg/dL 8.2(L) 8.3(L) 9.6  Total Protein 6.5 - 8.1 g/dL 6.8 7.0 8.56)  Total Bilirubin 0.3 - 1.2 mg/dL 3.14) ) 2.1(H)  Alkaline Phos 38 - 126 U/L 199(H) 255(H) 300(H)  AST 15 - 41 U/L 81(H) 266(H) 984(H)  ALT 0 - 44 U/L 369(H) 662(H) 937(H)   Lipase: 69  Imaging studies: No new pertinent imaging studies   Assessment/Plan: Status post robotic cholecystectomy now postop 16 days.  Symptomatic pancreatitis.  LFTs and WBC trending down.  Lipase trending down. Pain exacerbated with solid foods.  Patient Active Problem List   Diagnosis Date Noted  . Acute pancreatitis 04/05/2020  . Status post laparoscopic cholecystectomy 04/04/2020  . Type 2 diabetes mellitus without complication (HCC) 03/21/2020  . Hyperglycemia 03/20/2020  . Transaminitis 03/20/2020  . Hyponatremia 03/20/2020    -We will await return of his appetite, monitor his clinical status.  -I would like to repeat his labs tomorrow, as his white blood cell count remains mildly abnormal..  We will continue to follow along with you.  All of the above findings and recommendations were discussed with the patient, and the medical team, and all of patient's questions were answered to his expressed satisfaction.  -- 05/20/2020, M.D., [facility] 04/07/2020

## 2020-04-08 LAB — BASIC METABOLIC PANEL
Anion gap: 8 (ref 5–15)
BUN: 6 mg/dL (ref 6–20)
CO2: 24 mmol/L (ref 22–32)
Calcium: 8.2 mg/dL — ABNORMAL LOW (ref 8.9–10.3)
Chloride: 101 mmol/L (ref 98–111)
Creatinine, Ser: 0.65 mg/dL (ref 0.61–1.24)
GFR, Estimated: >60 mL/min (ref >60)
Glucose, Bld: 101 mg/dL — ABNORMAL HIGH (ref 70–99)
Potassium: 3.5 mmol/L (ref 3.5–5.1)
Sodium: 133 mmol/L — ABNORMAL LOW (ref 135–145)

## 2020-04-08 LAB — HEPATITIS B VIRUS (PROFILE VI)
HEP B CORE AB: NEGATIVE
HEP B CORE IGM: NEGATIVE
HEP B SURFACE AB: NONREACTIVE
HEP B SURFACE AG: NEGATIVE
Hep B E Ab: NEGATIVE
Hep B E Ag: NEGATIVE

## 2020-04-08 LAB — HEPATIC FUNCTION PANEL
ALT: 239 U/L — ABNORMAL HIGH (ref 0–44)
AST: 37 U/L (ref 15–41)
Albumin: 2.9 g/dL — ABNORMAL LOW (ref 3.5–5.0)
Alkaline Phosphatase: 213 U/L — ABNORMAL HIGH (ref 38–126)
Bilirubin, Direct: 0.4 mg/dL — ABNORMAL HIGH (ref 0.0–0.2)
Indirect Bilirubin: 0.9 mg/dL (ref 0.3–0.9)
Total Bilirubin: 1.3 mg/dL — ABNORMAL HIGH (ref 0.3–1.2)
Total Protein: 6.9 g/dL (ref 6.5–8.1)

## 2020-04-08 LAB — CBC
HCT: 36.8 % — ABNORMAL LOW (ref 39.0–52.0)
Hemoglobin: 12.3 g/dL — ABNORMAL LOW (ref 13.0–17.0)
MCH: 30.2 pg (ref 26.0–34.0)
MCHC: 33.4 g/dL (ref 30.0–36.0)
MCV: 90.4 fL (ref 80.0–100.0)
Platelets: 259 10*3/uL (ref 150–400)
RBC: 4.07 MIL/uL — ABNORMAL LOW (ref 4.22–5.81)
RDW: 12.7 % (ref 11.5–15.5)
WBC: 11.9 10*3/uL — ABNORMAL HIGH (ref 4.0–10.5)
nRBC: 0 % (ref 0.0–0.2)

## 2020-04-08 LAB — GLUCOSE, CAPILLARY
Glucose-Capillary: 107 mg/dL — ABNORMAL HIGH (ref 70–99)
Glucose-Capillary: 108 mg/dL — ABNORMAL HIGH (ref 70–99)
Glucose-Capillary: 117 mg/dL — ABNORMAL HIGH (ref 70–99)
Glucose-Capillary: 121 mg/dL — ABNORMAL HIGH (ref 70–99)

## 2020-04-08 LAB — MAGNESIUM: Magnesium: 2.2 mg/dL (ref 1.7–2.4)

## 2020-04-08 NOTE — Plan of Care (Signed)

## 2020-04-08 NOTE — Consult Note (Signed)
Pharmacy Antibiotic Note  Matthew Stevenson is a 40 y.o. male admitted on 04/05/2020 with acute pancreatitis.  Pharmacy has been consulted for Zosyn dosing.  Plan:  Day 4- Zosyn 3.375g IV q8h (4 hour infusion).      Height: 5\' 5"  (165.1 cm) Weight: 79.4 kg (175 lb) IBW/kg (Calculated) : 61.5  Temp (24hrs), Avg:98.5 F (36.9 C), Min:98.2 F (36.8 C), Max:99.3 F (37.4 C)  Recent Labs  Lab 04/05/20 0125 04/05/20 0400 04/05/20 0610 04/06/20 0516 04/07/20 0555 04/07/20 0909 04/08/20 0509  WBC 13.4*  --   --  11.7* 12.5*  --  11.9*  CREATININE 0.80  --   --  0.79 0.60*  --  0.65  LATICACIDVEN  --  2.3* 2.0*  --   --  1.3  --     Estimated Creatinine Clearance: 119.3 mL/min (by C-G formula based on SCr of 0.65 mg/dL).    No Known Allergies  Antimicrobials this admission: Metronidazole 3/18>>3/19 Zosyn 3/18 >>   Microbiology results: 3/18 BCx: NGTD   Thank you for allowing pharmacy to be a part of this patient's care.  Nicasio Barlowe A 04/08/2020 1:42 PM

## 2020-04-08 NOTE — Progress Notes (Signed)
[facility] SUR[facility]ICAL PROGRESS NOTE  Hospital Day(s): 3.   Interval History: Patient seen and examined, no acute events or new complaints overnight. Patient reports he has "made a small improvement compared to yesterday." He still has epigastric abdominal pain. He denies fever, chills, nausea, emesis. His leukocytosis is improved this morning, down to 11.9K. He is maintaining his renal function, sCr - 0.65, good UO. No significant electrolyte derangements. He has been on CLD without issues.   Review of Systems:  Constitutional: denies fever, chills  Gastrointestinal: + abdominal pain (mild improvement), denied N/V, or diarrhea/and bowel function as per interval history  Vital signs in last 24 hours: [min-max] current  Temp:  [98.2 F (36.8 C)-99.3 F (37.4 C)] 98.2 F (36.8 C) (03/21 0310) Pulse Rate:  [75-89] 75 (03/21 0310) Resp:  [16-18] 16 (03/21 0310) BP: (110-124)/(67-79) 117/78 (03/21 0310) SpO2:  [94 %-99 %] 98 % (03/21 0310)     Height: 5\' 5"  (165.1 cm) Weight: 79.4 kg BMI (Calculated): 29.12   Intake/Output last 2 shifts:  03/20 0701 - 03/21 0700 In: 3142.2 [I.V.:3009.1; IV Piggyback:133.1] Out: 1700 [Urine:1700]   Physical Exam:  Constitutional: alert, cooperative and no distress  HENT: normocephalic without obvious abnormality  Eyes: PERRL, EOM's grossly intact and symmetric  Respiratory: breathing non-labored at rest  Cardiovascular: regular rate and sinus rhythm  Gastrointestinal: Soft, epigastric tenderness, and non-distended. No rebound/guarding Musculoskeletal: no edema or wounds, motor and sensation grossly intact, NT    Labs:  CBC Latest Ref Rng & Units 04/08/2020 04/07/2020 04/06/2020  WBC 4.0 - 10.5 K/uL 11.9(H) 12.5(H) 11.7(H)  Hemoglobin 13.0 - 17.0 g/dL 12.3(L) 12.8(L) 13.2  Hematocrit 39.0 - 52.0 % 36.8(L) 37.7(L) 39.5  Platelets 150 - 400 K/uL 259 256 303   CMP Latest Ref Rng & Units 04/08/2020 04/07/2020 04/06/2020  Glucose 70 - 99  mg/dL 04/08/2020) 500(X) 381(W)  BUN 6 - 20 mg/dL 6 9 15   Creatinine 0.61 - 1.24 mg/dL 299(B ) 7.16  Sodium 135 - 145 mmol/L 133(L) 134(L) 134(L)  Potassium 3.5 - 5.1 mmol/L 3.5 3.4(L) 3.6  Chloride 98 - 111 mmol/L 101 102 103  CO2 22 - 32 mmol/L 24 24 25   Calcium 8.9 - 10.3 mg/dL 8.2(L) 8.2(L) 8.3(L)  Total Protein 6.5 - 8.1 g/dL - 6.8 7.0  Total Bilirubin 0.3 - 1.2 mg/dL - 9.67(E) 9.38)  Alkaline Phos 38 - 126 U/L - 199(H) 255(H)  AST 15 - 41 U/L - 81(H) 266(H)  ALT 0 - 44 U/L - 369(H) 662(H)     Imaging studies: No new pertinent imaging studies   Assessment/Plan:  40 y.o. male with persistent, but mildly improvement abdominal pain admitted with acute pancreatitis 17 days s/p robotic assisted laparoscopic cholecystectomy for acute cholecystitis.   - Continue CLD for now. Recommend waiting for further pain improvement prior to advancing diet  - Continue IVF support  - Monitor abdominal examination  - Pain control prn; antiemetics prn   - Monitor leukocytosis; improved   - Mobilization as toelrated   - Further management per primary service     All of the above findings and recommendations were discussed with the patient, and the medical team, and all of patient's questions were answered to his expressed satisfaction.  -- 1.0(F, PA-C [facility] 04/08/2020, 7:24 AM [phone] M-F: 7am - 4pm

## 2020-04-08 NOTE — Progress Notes (Signed)
PROGRESS NOTE    Matthew Stevenson  HQI:[id number] DOB: January 10, 1981 DOA: 04/05/2020 PCP: Patient, No Pcp Per  155A/155A-AA   Assessment & Plan:   Principal Problem:   Acute pancreatitis Active Problems:   Transaminitis   Type 2 diabetes mellitus without complication (HCC)  Matthew Stevenson is a 40 y.o. male with medical history significant for recent laparoscopic cholecystectomy for acute gallstone pancreatitis which was done 2 weeks ago, status post drain placement and removal on 04/04/20 and diabetes mellitus who presents to the emergency room for evaluation of sudden onset abdominal pain which was mostly in the epigastrium and peri umbilical area. He rated his pain a 10/10 in intensity at its worst, and pain was associated with nausea and multiple episodes of vomiting.   Acute pancreatitis, POA In a patient who is status post recent laparoscopic cholecystectomy for gallstone pancreatitis He presents for evaluation of abdominal pain and has elevated lipase levels (6933-->309 next morning already) and transaminitis.   Imaging does not show evidence of a common bile duct stone but is concerning for possible cholangitis --Pt started on Zosyn.  Flagyl for possible liver abscess noted on CT scan, which was d/c'ed since zosyn covers. --GI and surgery consulted --procal 0.14 Plan: --cont zosyn for empiric tx for now --decrease MIVF to 100 ml/hr --advance to full liquid --Norco for pain, no need for IV pain meds  Elevated LFT's, improved --2/2 pancreatitis, trending down now  Diabetes mellitus, poorly controlled --A1c 8.5.  Takes metformin at home. --SSI TID while inpatient --Hold home metformin    DVT prophylaxis: Lovenox SQ Code Status: Full code  Family Communication:  Level of care: Med-Surg Dispo:   The patient is from: home Anticipated d/c is to: home Anticipated d/c date is: 1-2 days Patient currently is not medically ready to d/c due to: need to be on IVF for  pancreatitis, and not tolerating regular diet yet.  Still having significant abdominal pain.   Subjective and Interval History:  Pt reported tolerating full liquid diet, however, needed Norco q6h for pain control.  No N/V/D.   Objective: Vitals:   04/08/20 0310 04/08/20 0802 04/08/20 1148 04/08/20 1456  BP: 117/78 111/80 125/75 118/75  Pulse: 75 83 74 81  Resp: 16 16 16 15   Temp: 98.2 F (36.8 C) 98.6 F (37 C) 98.5 F (36.9 C) 98.7 F (37.1 C)  TempSrc:      SpO2: 98% 96% 99% 97%  Weight:      Height:        Intake/Output Summary (Last 24 hours) at 04/08/2020 1710 Last data filed at 04/08/2020 1300 Gross per 24 hour  Intake 2518.4 ml  Output 1300 ml  Net 1218.4 ml   Filed Weights   04/05/20 0117  Weight: 79.4 kg    Examination:   Constitutional: NAD, AAOx3 HEENT: conjunctivae and lids normal, EOMI CV: No cyanosis.   RESP: normal respiratory effort, on RA Extremities: No effusions, edema in BLE SKIN: warm, dry Neuro: II - XII grossly intact.   Psych: Normal mood and affect.  Appropriate judgement and reason   Data Reviewed: I have personally reviewed following labs and imaging studies  CBC: Recent Labs  Lab 04/05/20 0125 04/06/20 0516 04/07/20 0555 04/08/20 0509  WBC 13.4* 11.7* 12.5* 11.9*  HGB 14.5 13.2 12.8* 12.3*  HCT 43.4 39.5 37.7* 36.8*  MCV 89.5 90.0 88.9 90.4  PLT 375 303 256 259   Basic Metabolic Panel: Recent Labs  Lab 04/05/20 0125 04/06/20 0516 04/07/20  0973 04/08/20 0509  NA 136 134* 134* 133*  K 4.0 3.6 3.4* 3.5  CL 100 103 102 101  CO2 26 25 24 24   GLUCOSE 223* 102* 105* 101*  BUN 16 15 9 6   CREATININE 0.80 0.79 0.60* 0.65  CALCIUM 9.6 8.3* 8.2* 8.2*  MG  --   --  2.1 2.2   GFR: Estimated Creatinine Clearance: 119.3 mL/min (by C-G formula based on SCr of 0.65 mg/dL). Liver Function Tests: Recent Labs  Lab 04/05/20 0125 04/06/20 0516 04/07/20 0555 04/08/20 0509  AST 984* 266* 81* 37  ALT 937* 662* 369* 239*   ALKPHOS 300* 255* 199* 213*  BILITOT 2.1* 1.6* 1.4* 1.3*  PROT 8.7* 7.0 6.8 6.9  ALBUMIN 4.3 3.4* 3.1* 2.9*   Recent Labs  Lab 04/05/20 0125 04/06/20 0516 04/07/20 0550  LIPASE 6,933* 309* 69*   No results for input(s): AMMONIA in the last 168 hours. Coagulation Profile: No results for input(s): INR, PROTIME in the last 168 hours. Cardiac Enzymes: No results for input(s): CKTOTAL, CKMB, CKMBINDEX, TROPONINI in the last 168 hours. BNP (last 3 results) No results for input(s): PROBNP in the last 8760 hours. HbA1C: No results for input(s): HGBA1C in the last 72 hours. CBG: Recent Labs  Lab 04/07/20 1627 04/07/20 2019 04/08/20 0802 04/08/20 1148 04/08/20 1647  GLUCAP 93 136* 107* 108* 117*   Lipid Profile: No results for input(s): CHOL, HDL, LDLCALC, TRIG, CHOLHDL, LDLDIRECT in the last 72 hours. Thyroid Function Tests: No results for input(s): TSH, T4TOTAL, FREET4, T3FREE, THYROIDAB in the last 72 hours. Anemia Panel: No results for input(s): VITAMINB12, FOLATE, FERRITIN, TIBC, IRON, RETICCTPCT in the last 72 hours. Sepsis Labs: Recent Labs  Lab 04/05/20 0400 04/05/20 0610 04/07/20 0909  PROCALCITON 0.14  --   --   LATICACIDVEN 2.3* 2.0* 1.3    Recent Results (from the past 240 hour(s))  Resp Panel by RT-PCR (Flu A&B, Covid) Nasopharyngeal Swab     Status: None   Collection Time: 04/05/20  8:02 AM   Specimen: Nasopharyngeal Swab; Nasopharyngeal(NP) swabs in vial transport medium  Result Value Ref Range Status   SARS Coronavirus 2 by RT PCR NEGATIVE NEGATIVE Final    Comment: (NOTE) SARS-CoV-2 target nucleic acids are NOT DETECTED.  The SARS-CoV-2 RNA is generally detectable in upper respiratory specimens during the acute phase of infection. The lowest concentration of SARS-CoV-2 viral copies this assay can detect is 138 copies/mL. A negative result does not preclude SARS-Cov-2 infection and should not be used as the sole basis for treatment or other patient  management decisions. A negative result may occur with  improper specimen collection/handling, submission of specimen other than nasopharyngeal swab, presence of viral mutation(s) within the areas targeted by this assay, and inadequate number of viral copies(<138 copies/mL). A negative result must be combined with clinical observations, patient history, and epidemiological information. The expected result is Negative.  Fact Sheet for Patients:  04/09/20  Fact Sheet for Healthcare Providers:  04/07/20  This test is no t yet approved or cleared by the [employer] FDA and  has been authorized for detection and/or diagnosis of SARS-CoV-2 by FDA under an Emergency Use Authorization (EUA). This EUA will remain  in effect (meaning this test can be used) for the duration of the COVID-19 declaration under Section 564(b)(1) of the Act, 21 U.S.C.section 360bbb-3(b)(1), unless the authorization is terminated  or revoked sooner.       Influenza A by PCR NEGATIVE NEGATIVE Final   Influenza  B by PCR NEGATIVE NEGATIVE Final    Comment: (NOTE) The Xpert Xpress SARS-CoV-2/FLU/RSV plus assay is intended as an aid in the diagnosis of influenza from Nasopharyngeal swab specimens and should not be used as a sole basis for treatment. Nasal washings and aspirates are unacceptable for Xpert Xpress SARS-CoV-2/FLU/RSV testing.  Fact Sheet for Patients: BloggerCourse.com  Fact Sheet for Healthcare Providers: SeriousBroker.it  This test is not yet approved or cleared by the [place] FDA and has been authorized for detection and/or diagnosis of SARS-CoV-2 by FDA under an Emergency Use Authorization (EUA). This EUA will remain in effect (meaning this test can be used) for the duration of the COVID-19 declaration under Section 564(b)(1) of the Act, 21 U.S.C. section 360bbb-3(b)(1),  unless the authorization is terminated or revoked.  Performed at [facility], [address] [place], [place] [address]   Blood culture (routine x 2)     Status: None (Preliminary result)   Collection Time: 04/05/20  9:30 AM   Specimen: BLOOD  Result Value Ref Range Status   Specimen Description BLOOD  LEFT [facility]  Final   Special Requests   Final    BOTTLES DRAWN AEROBIC AND ANAEROBIC Blood Culture adequate volume   Culture   Final    NO GROWTH 3 DAYS Performed at [facility], [address] [place], [place] [address]    Report Status PENDING  Incomplete  Blood culture (routine x 2)     Status: None (Preliminary result)   Collection Time: 04/05/20  9:40 AM   Specimen: BLOOD  Result Value Ref Range Status   Specimen Description BLOOD  LEFT HAND  Final   Special Requests   Final    BOTTLES DRAWN AEROBIC AND ANAEROBIC Blood Culture results may not be optimal due to an excessive volume of blood received in culture bottles   Culture   Final    NO GROWTH 3 DAYS Performed at [facility], [address] [place], [place] [address]    Report Status PENDING  Incomplete      Radiology Studies: No results found.   Scheduled Meds: . enoxaparin (LOVENOX) injection  40 mg Subcutaneous Q24H   Continuous Infusions: . sodium chloride 150 mL/hr at 04/08/20 0956  . piperacillin-tazobactam (ZOSYN)  IV 3.375 g (04/08/20 1357)     LOS: 3 days     Darlin Priestly, MD [facility] If 7PM-7AM, please contact night-coverage 04/08/2020, 5:10 PM

## 2020-04-09 LAB — CBC
HCT: 35.9 % — ABNORMAL LOW (ref 39.0–52.0)
Hemoglobin: 12.2 g/dL — ABNORMAL LOW (ref 13.0–17.0)
MCH: 30 pg (ref 26.0–34.0)
MCHC: 34 g/dL (ref 30.0–36.0)
MCV: 88.2 fL (ref 80.0–100.0)
Platelets: 261 10*3/uL (ref 150–400)
RBC: 4.07 MIL/uL — ABNORMAL LOW (ref 4.22–5.81)
RDW: 12.7 % (ref 11.5–15.5)
WBC: 12.8 10*3/uL — ABNORMAL HIGH (ref 4.0–10.5)
nRBC: 0 % (ref 0.0–0.2)

## 2020-04-09 LAB — GLUCOSE, CAPILLARY
Glucose-Capillary: 109 mg/dL — ABNORMAL HIGH (ref 70–99)
Glucose-Capillary: 111 mg/dL — ABNORMAL HIGH (ref 70–99)
Glucose-Capillary: 141 mg/dL — ABNORMAL HIGH (ref 70–99)
Glucose-Capillary: 149 mg/dL — ABNORMAL HIGH (ref 70–99)

## 2020-04-09 LAB — BASIC METABOLIC PANEL
Anion gap: 8 (ref 5–15)
BUN: 6 mg/dL (ref 6–20)
CO2: 26 mmol/L (ref 22–32)
Calcium: 8.8 mg/dL — ABNORMAL LOW (ref 8.9–10.3)
Chloride: 102 mmol/L (ref 98–111)
Creatinine, Ser: 0.64 mg/dL (ref 0.61–1.24)
GFR, Estimated: >60 mL/min (ref >60)
Glucose, Bld: 107 mg/dL — ABNORMAL HIGH (ref 70–99)
Potassium: 3.7 mmol/L (ref 3.5–5.1)
Sodium: 136 mmol/L (ref 135–145)

## 2020-04-09 LAB — MAGNESIUM: Magnesium: 2.3 mg/dL (ref 1.7–2.4)

## 2020-04-09 MED ORDER — AMOXICILLIN-POT CLAVULANATE 875-125 MG PO TABS
1.0000 | ORAL_TABLET | Freq: Two times a day (BID) | ORAL | Status: DC
  Administered 2020-04-09 – 2020-04-10 (×3): 1 via ORAL
  Filled 2020-04-09 (×4): qty 1

## 2020-04-09 MED ORDER — HYDROCODONE-ACETAMINOPHEN 5-325 MG PO TABS: 1.0000 | ORAL_TABLET | Freq: Once | ORAL | Status: DC | PRN

## 2020-04-09 MED ORDER — ACETAMINOPHEN 500 MG PO TABS
1000.0000 mg | ORAL_TABLET | Freq: Three times a day (TID) | ORAL | Status: DC | PRN
  Administered 2020-04-09 – 2020-04-10 (×2): 1000 mg via ORAL
  Filled 2020-04-09 (×2): qty 2

## 2020-04-09 NOTE — Plan of Care (Signed)
  Problem: Education: Goal: Knowledge of General Education information will improve Description Including pain rating scale, medication(s)/side effects and non-pharmacologic comfort measures Outcome: Progressing   

## 2020-04-09 NOTE — Progress Notes (Signed)
[facility] SURGICAL PROGRESS NOTE (cpt [phone])  Hospital Day(s): 4.   Interval History: Patient seen and examined, no acute events or new complaints overnight. Patient reports he is feeling much better this morning and is having minimal pain. He denies fever, chills, nausea, emesis. He does continue to have fluctuations in his leukocytosis, now 12.8K. Otherwise, labs are unremarkable. He was advanced to full liquid diet, and is ready for a normal diet.   Review of Systems:  Constitutional: denies fever, chills  HEENT: denies cough or congestion  Respiratory: denies any shortness of breath  Cardiovascular: denies chest pain or palpitations  Gastrointestinal: denies abdominal pain, N/V, or diarrhea/and bowel function as per interval history Genitourinary: denies burning with urination or urinary frequency  Vital signs in last 24 hours: [min-max] current  Temp:  [98 F (36.7 C)-98.7 F (37.1 C)] 98 F (36.7 C) (03/22 0411) Pulse Rate:  [74-99] 76 (03/22 0411) Resp:  [15-17] 16 (03/22 0411) BP: (111-129)/(75-89) 111/77 (03/22 0411) SpO2:  [96 %-99 %] 98 % (03/22 0411)     Height: 5\' 5"  (165.1 cm) Weight: 79.4 kg BMI (Calculated): 29.12   Intake/Output last 2 shifts:  03/21 0701 - 03/22 0700 In: 740 [P.O.:740] Out: 1000 [Urine:1000]   Physical Exam:  Constitutional: alert, cooperative and no distress  HENT: normocephalic without obvious abnormality  Eyes: PERRL, EOM's grossly intact and symmetric  Respiratory: breathing non-labored at rest  Cardiovascular: regular rate and sinus rhythm  Gastrointestinal: Soft, non-tender, and non-distended. No rebound/guarding Musculoskeletal: no edema or wounds, motor and sensation grossly intact, NT   Labs:  CBC Latest Ref Rng & Units 04/09/2020 04/08/2020 04/07/2020  WBC 4.0 - 10.5 K/uL 12.8(H) 11.9(H) 12.5(H)  Hemoglobin 13.0 - 17.0 g/dL 12.2(L) 12.3(L) 12.8(L)  Hematocrit 39.0 - 52.0 % 35.9(L) 36.8(L) 37.7(L)  Platelets 150 -  400 K/uL 261 259 256   CMP Latest Ref Rng & Units 04/09/2020 04/08/2020 04/07/2020  Glucose 70 - 99 mg/dL 04/09/2020) 751(W) 258(N)  BUN 6 - 20 mg/dL 6 6 9   Creatinine 0.61 - 1.24 mg/dL 277(O 2.42)  Sodium 135 - 145 mmol/L 136 133(L) 134(L)  Potassium 3.5 - 5.1 mmol/L 3.7 3.5 3.4(L)  Chloride 98 - 111 mmol/L 102 101 102  CO2 22 - 32 mmol/L 26 24 24   Calcium 8.9 - 10.3 mg/dL 3.53) 6.14(E) )  Total Protein 6.5 - 8.1 g/dL - 6.9 6.8  Total Bilirubin 0.3 - 1.2 mg/dL - 1.3(H) 1.4(H)  Alkaline Phos 38 - 126 U/L - 213(H) 199(H)  AST 15 - 41 U/L - 37 81(H)  ALT 0 - 44 U/L - 239(H) 369(H)     Imaging studies: No new pertinent imaging studies   Assessment/Plan:  40 y.o. male with now resolved abdominal pain admitted with acute pancreatitis 18 days s/p robotic assisted laparoscopic cholecystectomy for acute cholecystitis.   - Advance to regular diet             - Monitor abdominal examination             - Pain control prn; antiemetics prn                   - Monitor leukocytosis             - Mobilization as toelrated              - Further management per primary service    - Discharge Planning; If he tolerates regular diet this afternoon, I think  it would be reasonable to discharge home. Nothing further to add from surgical standpoint     All of the above findings and recommendations were discussed with the patient, and the medical team, and all of patient's questions were answered to his expressed satisfaction.  -- Lynden Oxford, PA-C [facility] 04/09/2020, 7:08 AM [phone] M-F: 7am - 4pm

## 2020-04-09 NOTE — Progress Notes (Signed)
PROGRESS NOTE    Matthew Stevenson  PQZ:[id number] DOB: [birth year] DOA: 04/05/2020 PCP: Patient, No Pcp Per  155A/155A-AA   Assessment & Plan:   Principal Problem:   Acute pancreatitis Active Problems:   Transaminitis   Type 2 diabetes mellitus without complication (HCC)  Matthew Stevenson is a 40 y.o. male with medical history significant for recent laparoscopic cholecystectomy for acute gallstone pancreatitis which was done 2 weeks ago, status post drain placement and removal on 04/04/20 and diabetes mellitus who presents to the emergency room for evaluation of sudden onset abdominal pain which was mostly in the epigastrium and peri umbilical area. He rated his pain a 10/10 in intensity at its worst, and pain was associated with nausea and multiple episodes of vomiting.   Acute pancreatitis, POA In a patient who is status post recent laparoscopic cholecystectomy for gallstone pancreatitis He presents for evaluation of abdominal pain and has elevated lipase levels (6933-->309 next morning already) and transaminitis.   Imaging does not show evidence of a common bile duct stone but is concerning for possible cholangitis --Pt started on Zosyn.  Flagyl for possible liver abscess noted on CT scan, which was d/c'ed since zosyn covers. --GI and surgery consulted on admission. --procal 0.14 Plan: --de-escalate to Augmentin today to complete a 7-day course (last day 3/24) --d/c MIVF --back to clear liquid diet since pt had pain after trial low-fat solid diet --d/c Norco today.  Pt needs to tolerating diet without needing narcotics before advancing. --Tylenol PRN for pain  Elevated LFT's, improved --2/2 pancreatitis, trending down now  Diabetes mellitus, poorly controlled --A1c 8.5.  Takes metformin at home. --SSI TID while inpatient --Hold home metformin    DVT prophylaxis: Lovenox SQ Code Status: Full code  Family Communication:  Level of care: Med-Surg Dispo:   The patient  is from: home Anticipated d/c is to: home Anticipated d/c date is: 1-2 days Patient currently is not medically ready to d/c due to: not able to advance diet due to pain    Subjective and Interval History:  Opioids pain meds d/c'ed during the day fo see if pt can tolerate oral intake without it.  Pt asked for diet to be advanced, so low-fat diet ordered, however, nursing reported 5 of 10 pain after eating, so diet changed back to clear liquid.   Objective: Vitals:   04/08/20 2315 04/09/20 0411 04/09/20 0822 04/09/20 1120  BP: 119/86 111/77 114/82 116/89  Pulse: 86 76 83 86  Resp: 17 16 15 17   Temp: 98.2 F (36.8 C) 98 F (36.7 C) 97.7 F (36.5 C) 97.9 F (36.6 C)  TempSrc: Oral Oral    SpO2: 97% 98% 98% 98%  Weight:      Height:        Intake/Output Summary (Last 24 hours) at 04/09/2020 1558 Last data filed at 04/09/2020 1412 Gross per 24 hour  Intake 360 ml  Output 1550 ml  Net -1190 ml   Filed Weights   04/05/20 0117  Weight: 79.4 kg    Examination:   Constitutional: NAD, AAOx3 HEENT: conjunctivae and lids normal, EOMI CV: No cyanosis.   RESP: normal respiratory effort, on RA Extremities: No effusions, edema in BLE SKIN: warm, dry Neuro: II - XII grossly intact.   Psych: Normal mood and affect.  Appropriate judgement and reason   Data Reviewed: I have personally reviewed following labs and imaging studies  CBC: Recent Labs  Lab 04/05/20 0125 04/06/20 0516 04/07/20 0555 04/08/20 0509 04/09/20 04/11/20  WBC 13.4* 11.7* 12.5* 11.9* 12.8*  HGB 14.5 13.2 12.8* 12.3* 12.2*  HCT 43.4 39.5 37.7* 36.8* 35.9*  MCV 89.5 90.0 88.9 90.4 88.2  PLT 375 303 256 259 261   Basic Metabolic Panel: Recent Labs  Lab 04/05/20 0125 04/06/20 0516 04/07/20 0555 04/08/20 0509 04/09/20 0509  NA 136 134* 134* 133* 136  K 4.0 3.6 3.4* 3.5 3.7  CL 100 103 102 101 102  CO2 26 25 24 24 26   GLUCOSE 223* 102* 105* 101* 107*  BUN 16 15 9 6 6   CREATININE 0.80 0.79 0.60* 0.65  0.64  CALCIUM 9.6 8.3* 8.2* 8.2* 8.8*  MG  --   --  2.1 2.2 2.3   GFR: Estimated Creatinine Clearance: 119.3 mL/min (by C-G formula based on SCr of 0.64 mg/dL). Liver Function Tests: Recent Labs  Lab 04/05/20 0125 04/06/20 0516 04/07/20 0555 04/08/20 0509  AST 984* 266* 81* 37  ALT 937* 662* 369* 239*  ALKPHOS 300* 255* 199* 213*  BILITOT 2.1* 1.6* 1.4* 1.3*  PROT 8.7* 7.0 6.8 6.9  ALBUMIN 4.3 3.4* 3.1* 2.9*   Recent Labs  Lab 04/05/20 0125 04/06/20 0516 04/07/20 0550  LIPASE 6,933* 309* 69*   No results for input(s): AMMONIA in the last 168 hours. Coagulation Profile: No results for input(s): INR, PROTIME in the last 168 hours. Cardiac Enzymes: No results for input(s): CKTOTAL, CKMB, CKMBINDEX, TROPONINI in the last 168 hours. BNP (last 3 results) No results for input(s): PROBNP in the last 8760 hours. HbA1C: No results for input(s): HGBA1C in the last 72 hours. CBG: Recent Labs  Lab 04/08/20 1647 04/08/20 2123 04/09/20 0825 04/09/20 1222 04/09/20 1550  GLUCAP 117* 121* 109* 111* 141*   Lipid Profile: No results for input(s): CHOL, HDL, LDLCALC, TRIG, CHOLHDL, LDLDIRECT in the last 72 hours. Thyroid Function Tests: No results for input(s): TSH, T4TOTAL, FREET4, T3FREE, THYROIDAB in the last 72 hours. Anemia Panel: No results for input(s): VITAMINB12, FOLATE, FERRITIN, TIBC, IRON, RETICCTPCT in the last 72 hours. Sepsis Labs: Recent Labs  Lab 04/05/20 0400 04/05/20 0610 04/07/20 0909  PROCALCITON 0.14  --   --   LATICACIDVEN 2.3* 2.0* 1.3    Recent Results (from the past 240 hour(s))  Resp Panel by RT-PCR (Flu A&B, Covid) Nasopharyngeal Swab     Status: None   Collection Time: 04/05/20  8:02 AM   Specimen: Nasopharyngeal Swab; Nasopharyngeal(NP) swabs in vial transport medium  Result Value Ref Range Status   SARS Coronavirus 2 by RT PCR NEGATIVE NEGATIVE Final    Comment: (NOTE) SARS-CoV-2 target nucleic acids are NOT DETECTED.  The SARS-CoV-2  RNA is generally detectable in upper respiratory specimens during the acute phase of infection. The lowest concentration of SARS-CoV-2 viral copies this assay can detect is 138 copies/mL. A negative result does not preclude SARS-Cov-2 infection and should not be used as the sole basis for treatment or other patient management decisions. A negative result may occur with  improper specimen collection/handling, submission of specimen other than nasopharyngeal swab, presence of viral mutation(s) within the areas targeted by this assay, and inadequate number of viral copies(<138 copies/mL). A negative result must be combined with clinical observations, patient history, and epidemiological information. The expected result is Negative.  Fact Sheet for Patients:  04/09/20  Fact Sheet for Healthcare Providers:  04/07/20  This test is no t yet approved or cleared by the [employer] FDA and  has been authorized for detection and/or diagnosis of SARS-CoV-2 by FDA under  an Emergency Use Authorization (EUA). This EUA will remain  in effect (meaning this test can be used) for the duration of the COVID-19 declaration under Section 564(b)(1) of the Act, 21 U.S.C.section 360bbb-3(b)(1), unless the authorization is terminated  or revoked sooner.       Influenza A by PCR NEGATIVE NEGATIVE Final   Influenza B by PCR NEGATIVE NEGATIVE Final    Comment: (NOTE) The Xpert Xpress SARS-CoV-2/FLU/RSV plus assay is intended as an aid in the diagnosis of influenza from Nasopharyngeal swab specimens and should not be used as a sole basis for treatment. Nasal washings and aspirates are unacceptable for Xpert Xpress SARS-CoV-2/FLU/RSV testing.  Fact Sheet for Patients: BloggerCourse.com  Fact Sheet for Healthcare Providers: SeriousBroker.it  This test is not yet approved or cleared by the  [place] FDA and has been authorized for detection and/or diagnosis of SARS-CoV-2 by FDA under an Emergency Use Authorization (EUA). This EUA will remain in effect (meaning this test can be used) for the duration of the COVID-19 declaration under Section 564(b)(1) of the Act, 21 U.S.C. section 360bbb-3(b)(1), unless the authorization is terminated or revoked.  Performed at [facility], [address] [place], [place] [address]   Blood culture (routine x 2)     Status: None (Preliminary result)   Collection Time: 04/05/20  9:30 AM   Specimen: BLOOD  Result Value Ref Range Status   Specimen Description BLOOD  LEFT [facility]  Final   Special Requests   Final    BOTTLES DRAWN AEROBIC AND ANAEROBIC Blood Culture adequate volume   Culture   Final    NO GROWTH 4 DAYS Performed at [facility], [address], [place], [place] [address]    Report Status PENDING  Incomplete  Blood culture (routine x 2)     Status: None (Preliminary result)   Collection Time: 04/05/20  9:40 AM   Specimen: BLOOD  Result Value Ref Range Status   Specimen Description BLOOD  LEFT HAND  Final   Special Requests   Final    BOTTLES DRAWN AEROBIC AND ANAEROBIC Blood Culture results may not be optimal due to an excessive volume of blood received in culture bottles   Culture   Final    NO GROWTH 4 DAYS Performed at [facility], [address] [place], [place] [address]    Report Status PENDING  Incomplete      Radiology Studies: No results found.   Scheduled Meds: . amoxicillin-clavulanate  1 tablet Oral Q12H  . enoxaparin (LOVENOX) injection  40 mg Subcutaneous Q24H   Continuous Infusions:    LOS: 4 days     Darlin Priestly, MD [facility] If 7PM-7AM, please contact night-coverage 04/09/2020, 3:58 PM

## 2020-04-10 ENCOUNTER — Other Ambulatory Visit: Payer: Self-pay | Admitting: Internal Medicine

## 2020-04-10 LAB — BASIC METABOLIC PANEL
Anion gap: 11 (ref 5–15)
BUN: 9 mg/dL (ref 6–20)
CO2: 25 mmol/L (ref 22–32)
Calcium: 9.1 mg/dL (ref 8.9–10.3)
Chloride: 99 mmol/L (ref 98–111)
Creatinine, Ser: 0.64 mg/dL (ref 0.61–1.24)
GFR, Estimated: >60 mL/min (ref >60)
Glucose, Bld: 119 mg/dL — ABNORMAL HIGH (ref 70–99)
Potassium: 3.7 mmol/L (ref 3.5–5.1)
Sodium: 135 mmol/L (ref 135–145)

## 2020-04-10 LAB — MAGNESIUM: Magnesium: 2.2 mg/dL (ref 1.7–2.4)

## 2020-04-10 LAB — CBC
HCT: 37.6 % — ABNORMAL LOW (ref 39.0–52.0)
Hemoglobin: 12.6 g/dL — ABNORMAL LOW (ref 13.0–17.0)
MCH: 30 pg (ref 26.0–34.0)
MCHC: 33.5 g/dL (ref 30.0–36.0)
MCV: 89.5 fL (ref 80.0–100.0)
Platelets: 268 10*3/uL (ref 150–400)
RBC: 4.2 MIL/uL — ABNORMAL LOW (ref 4.22–5.81)
RDW: 12.8 % (ref 11.5–15.5)
WBC: 13.4 10*3/uL — ABNORMAL HIGH (ref 4.0–10.5)
nRBC: 0 % (ref 0.0–0.2)

## 2020-04-10 LAB — CULTURE, BLOOD (ROUTINE X 2)
Culture: NO GROWTH
Culture: NO GROWTH
Special Requests: ADEQUATE

## 2020-04-10 LAB — GLUCOSE, CAPILLARY
Glucose-Capillary: 150 mg/dL — ABNORMAL HIGH (ref 70–99)
Glucose-Capillary: 134 mg/dL — ABNORMAL HIGH (ref 70–99)

## 2020-04-10 MED ORDER — AMOXICILLIN-POT CLAVULANATE 875-125 MG PO TABS: 1.0000 | ORAL_TABLET | Freq: Two times a day (BID) | ORAL | 0 refills | Status: DC

## 2020-04-10 MED ORDER — IBUPROFEN 800 MG PO TABS: 400.0000 mg | ORAL_TABLET | Freq: Three times a day (TID) | ORAL | 0 refills | Status: DC | PRN

## 2020-04-10 NOTE — Clinical Social Work Note (Signed)
[facility] will call CSW when his prescriptions are ready so CSW can pick up. RN aware.  Charlynn Court, CSW [phone]

## 2020-04-10 NOTE — Progress Notes (Signed)
DC order noted.  Reviewed discharge instructions with patient including follow up appointment with provider, medication, prescriptions, activity and when to notify physician.  Questions answered and understanding verbalized.  Copy given.  Patient discharged off unit at this time stable without complaint.  

## 2020-04-10 NOTE — Discharge Instructions (Signed)
Pancreatitis aguda Acute Pancreatitis  El pncreas es una glndula que se encuentra detrs del Isabel, del lado izquierdo del abdomen. Produce enzimas que ayudan a Retail buyer. El pncreas tambin libera las hormonas glucagn e insulina que regulan el nivel de azcar en la [place]. La pancreatitis aguda ocurre cuando la inflamacin del pncreas se produce repentinamente y el pncreas se irrita y se hincha. La [employer] de los ataques agudos duran [address] y pueden ocasionar problemas graves. Algunas personas se deshidratan y les baja la presin arterial. En los casos graves, el sangrado en el abdomen puede causar choque cardiovascular y ser potencialmente mortal. Los pulmones, el corazn y los riones Estate agent. Cules son las causas? Esta afeccin puede ser causada por lo siguiente:  Consumo excesivo de alcohol.  Abuso de drogas.  Clculos biliares u otras afecciones que pueden obstruir el conducto de drenaje del pncreas (conducto pancretico).  Un tumor en el pncreas. Otras causas son:  Ciertos medicamentos.  La exposicin a ciertas sustancias qumicas.  Diabetes.  Una infeccin en el pncreas.  Dao producido por un accidente (traumatismo).  La sustancia venenosa (veneno) de una picadura de escorpin.  Una ciruga abdominal.  Pancreatitis autoinmunitaria. Esto ocurre cuando el sistema del organismo encargado de Production manager contra las enfermedades (sistema inmunitario) ataca al pncreas.  Genes que se transmiten de padres a hijos (hereditarios). En algunos casos, se desconoce la causa de este trastorno. Cules son los signos o los sntomas? Los sntomas de esta afeccin incluyen:  Dolor en la zona superior del abdomen que puede irradiarse a la espalda. El dolor puede ser intenso.  Sensibilidad al tacto e hinchazn en el abdomen.  Nuseas y vmitos.  Grant Ruts. Cmo se diagnostica? Esta afeccin se puede diagnosticar en funcin de lo siguiente:  Un examen  fsico.  Anlisis de sangre.  Estudios de diagnstico por imgenes, como radiografas, exploracin por tomografa computarizada (TC) o Health visitor (RM) o una ecografa del abdomen. Cmo se trata? El tratamiento generalmente requiere que permanezca en el hospital. El tratamiento de esta afeccin puede incluir lo siguiente:  Tomar analgsicos.  Reponer lquidos a travs de una va intravenosa.  Colocar una va en el estmago para retirar el contenido del estmago y Chief Operating Officer los vmitos (sonda NG, o sonda nasogstrica).  No comer durante 3 o 4 das. Esto permite que el pncreas descanse y no produzca enzimas que podran causar ms dao.  Tomar antibiticos, si la causa de la afeccin es una infeccin.  El tratamiento de cualquier afeccin subyacente que pueda ser la causa.  Medicamentos con corticoesteroides, si la causa de la afeccin es que el sistema inmunitario ataca los tejidos propios del cuerpo (enfermedad autoinmunitaria).  Ciruga del pncreas o de la vescula biliar. Sigue estas instrucciones en tu casa: Comida y bebida  Siga las indicaciones del mdico acerca de la dieta. Puede consistir en evitar el alcohol y disminuir la cantidad de grasas que consume en la dieta.  Consuma porciones pequeas y con frecuencia. Esto reduce la cantidad de lquidos digestivos que produce el pncreas.  Beba suficiente lquido como para Pharmacologist la orina de color amarillo plido.  No consuma alcohol si esta fue la causa de su afeccin.   Indicaciones generales  Toma los medicamentos de venta libre y los recetados solamente como se lo haya indicado el mdico.  No conduzca ni use maquinaria pesada mientras toma analgsicos recetados.  Pregntele al mdico si el medicamento recetado puede causarle estreimiento. Es posible que deba tomar medidas para  prevenir o tratar el estreimiento, por ejemplo: ? Tome un medicamento recetado o de venta libre para el estreimiento. ? Consumir  alimentos ricos en fibra, como cereales integrales y frijoles. ? Limitar su consumo de alimentos ricos en grasa y azcares procesados, como los alimentos fritos o dulces.  No consuma ningn producto que contenga nicotina o tabaco, como cigarrillos, cigarrillos electrnicos y tabaco de Theatre manager. Si necesita ayuda para dejar de consumir, consulta al mdico.  Descanse mucho.  Si se lo indican, controle su nivel de azcar en la sangre en su hogar como se lo haya indicado el mdico.  Concurrir a todas las visitas de seguimiento como se lo haya indicado el mdico. Esto es importante. Comunquese con un mdico si:  No se recupera tan rpido como se esperaba.  Tiene sntomas nuevos o sus sntomas empeoran.  Tiene dolor persistente, debilidad o nuseas.  Se recupera y luego tiene otro episodio de Engineer, mining.  Tienes fiebre. Solicite ayuda inmediatamente si:  No puede comer ni retener lquidos.  Tiene dolor intenso.  Nota que la piel o las partes blancas del ojo estn amarillas (ictericia).  El abdomen se hincha de manera sbita.  Vomita.  Se siente mareado o se desmaya.  Su nivel de azcar en la sangre es alto (ms de 300mg /dl). Resumen  La pancreatitis aguda ocurre cuando la inflamacin del pncreas se produce repentinamente y el pncreas se irrita y se hincha.  Generalmente, esta afeccin es causada por el consumo excesivo de alcohol o drogas o la presencia de clculos biliares.  El tratamiento generalmente requiere que permanezca en el hospital. Esta informacin no tiene el consejo del mdico. Asegrese de hacerle al mdico cualquier pregunta que tenga. Document Revised: 10/27/2017 Document Reviewed: 10/27/2017 Elsevier Patient Education  2021 Elsevier Inc. Establish PCP in the area

## 2020-04-10 NOTE — Progress Notes (Signed)
Nas Tyreak Reagle to be D/C'd Home per MD order.  Discussed prescriptions and follow up appointments with the patient. Medication list explained in detail. Pt verbalized understanding. IV removed, clean dry, and intact. Pt awaiting meds to be delivered to patient's room.    Rigoberto Noel

## 2020-04-10 NOTE — Discharge Summary (Signed)
[facility] [place] at [facility]   PATIENT NAME: Matthew Stevenson    MR#:  [id number]  DATE OF BIRTH:  22-Nov-1980  DATE OF ADMISSION:  04/05/2020 ADMITTING PHYSICIAN: Lucile Shutters, MD  DATE OF DISCHARGE: 04/10/2020  PRIMARY CARE PHYSICIAN: Patient, No Pcp Per    ADMISSION DIAGNOSIS:  Acute pancreatitis [K85.90] Jaundice [R17] Acute pancreatitis, unspecified complication status, unspecified pancreatitis type [K85.90]  DISCHARGE DIAGNOSIS:  Acute Pancreatitis s/p Robotic assisted Lap cholecystectomy for acute cholecystitis (18 days ago)  SECONDARY DIAGNOSIS:   Past Medical History:  Diagnosis Date  . Acute gallstone pancreatitis 03/20/2020  . Diabetes mellitus without complication [facility])     HOSPITAL COURSE:   Matthew Skillin Gomezis a 39 y.o.malewith medical history significant forrecent laparoscopic cholecystectomy for acute gallstone pancreatitis which was done 2 weeks ago,status post drain placement and removalon 03/17/22and diabetes mellitus who presents to the emergency room for evaluation of sudden onset abdominal pain which was mostlyin the epigastrium and peri umbilical area. He rated his pain a 10/10 in intensity at its worst,and pain was associated with nausea and multiple episodes of vomiting.   Acute pancreatitis, POA Now resolved/improved --In a patient who is status post recent laparoscopic cholecystectomy for gallstone pancreatitis (18 days prior) --He presents for evaluation of abdominal pain and has elevated lipase levels (6933-->309 next morning already) and transaminitis.   --Imaging does not show evidence of a common bile duct stone but is concerning for possible cholangitis --completed empric antibiotic. Pt remains afebrile, no abd pain and advance to soft diet --GI and surgery consulted on admission. --procal 0.14 --Tylenol PRN for pain  Elevated LFT's, improved --2/2 pancreatitis, trending down now  Diabetes mellitus, poorly  controlled --A1c 8.5.  Takes metformin at home. --SSI TID while inpatient --resume home metformin at d/c    DVT prophylaxis: Lovenox SQ Code Status: Full code  Family Communication:  Level of care: Med-Surg Dispo:   The patient is from: home Anticipated d/c is to: home  Overall looking better. Pt is agreeable to go home. Per Ian Malkin, PA--pt does not need surgery f/u CONSULTS OBTAINED:  Treatment Team:  Campbell Lerner, MD  DRUG ALLERGIES:  No Known Allergies  DISCHARGE MEDICATIONS:   Allergies as of 04/10/2020   No Known Allergies     Medication List    TAKE these medications   acetaminophen 325 MG tablet Commonly known as: Tylenol Take 2 tablets (650 mg total) by mouth every 8 (eight) hours as needed for mild pain.   amoxicillin-clavulanate 875-125 MG tablet Commonly known as: AUGMENTIN Take 1 tablet by mouth every 12 (twelve) hours for 2 days.   ibuprofen 800 MG tablet Commonly known as: ADVIL Take 0.5 tablets (400 mg total) by mouth every 8 (eight) hours as needed for mild pain or moderate pain. What changed: how much to take   metFORMIN 500 MG tablet Commonly known as: Glucophage Take 1 tablet (500 mg total) by mouth 2 (two) times daily with a meal.       If you experience worsening of your admission symptoms, develop shortness of breath, life threatening emergency, suicidal or homicidal thoughts you must seek medical attention immediately by calling 911 or calling your MD immediately  if symptoms less severe.  You Must read complete instructions/literature along with all the possible adverse reactions/side effects for all the Medicines you take and that have been prescribed to you. Take any new Medicines after you have completely understood and accept all the possible adverse reactions/side effects.  Please note  You were cared for by a hospitalist during your hospital stay. If you have any questions about your discharge medications or the care you  received while you were in the hospital after you are discharged, you can call the unit and asked to speak with the hospitalist on call if the hospitalist that took care of you is not available. Once you are discharged, your primary care physician will handle any further medical issues. Please note that NO REFILLS for any discharge medications will be authorized once you are discharged, as it is imperative that you return to your primary care physician (or establish a relationship with a primary care physician if you do not have one) for your aftercare needs so that they can reassess your need for medications and monitor your lab values. Today   SUBJECTIVE   Doing well. Wants to eat soft diet  VITAL SIGNS:  Blood pressure 118/87, pulse (!) 101, temperature 98.4 F (36.9 C), resp. rate 16, height 5\' 5"  (1.651 m), weight 79.4 kg, SpO2 100 %.  I/O:    Intake/Output Summary (Last 24 hours) at 04/10/2020 0915 Last data filed at 04/09/2020 2200 Gross per 24 hour  Intake 720 ml  Output --  Net 720 ml    PHYSICAL EXAMINATION:  GENERAL:  40 y.o.-year-old patient lying in the bed with no acute distress.    LUNGS: Normal breath sounds bilaterally, no wheezing, rales,rhonchi or crepitation. No use of accessory muscles of respiration.  CARDIOVASCULAR: S1, S2 normal. No murmurs, rubs, or gallops.  ABDOMEN: Soft, non-tender, non-distended. Bowel sounds present. No organomegaly or mass.  EXTREMITIES: No pedal edema, cyanosis, or clubbing.  NEUROLOGIC: Cranial nerves II through XII are intact. Muscle strength 5/5 in all extremities. Sensation intact. Gait not checked.  PSYCHIATRIC: The patient is alert and oriented x 3.  SKIN: No obvious rash, lesion, or ulcer.   DATA REVIEW:   CBC  Recent Labs  Lab 04/10/20 0535  WBC 13.4*  HGB 12.6*  HCT 37.6*  PLT 268    Chemistries  Recent Labs  Lab 04/08/20 0509 04/09/20 0509 04/10/20 0535  NA 133*   < > 135  K 3.5   < > 3.7  CL 101   < > 99   CO2 24   < > 25  GLUCOSE 101*   < > 119*  BUN 6   < > 9  CREATININE 0.65   < > 0.64  CALCIUM 8.2*   < > 9.1  MG 2.2   < > 2.2  AST 37  --   --   ALT 239*  --   --   ALKPHOS 213*  --   --   BILITOT 1.3*  --   --    < > = values in this interval not displayed.    Microbiology Results   Recent Results (from the past 240 hour(s))  Resp Panel by RT-PCR (Flu A&B, Covid) Nasopharyngeal Swab     Status: None   Collection Time: 04/05/20  8:02 AM   Specimen: Nasopharyngeal Swab; Nasopharyngeal(NP) swabs in vial transport medium  Result Value Ref Range Status   SARS Coronavirus 2 by RT PCR NEGATIVE NEGATIVE Final    Comment: (NOTE) SARS-CoV-2 target nucleic acids are NOT DETECTED.  The SARS-CoV-2 RNA is generally detectable in upper respiratory specimens during the acute phase of infection. The lowest concentration of SARS-CoV-2 viral copies this assay can detect is 138 copies/mL. A negative result does not preclude SARS-Cov-2 infection  and should not be used as the sole basis for treatment or other patient management decisions. A negative result may occur with  improper specimen collection/handling, submission of specimen other than nasopharyngeal swab, presence of viral mutation(s) within the areas targeted by this assay, and inadequate number of viral copies(<138 copies/mL). A negative result must be combined with clinical observations, patient history, and epidemiological information. The expected result is Negative.  Fact Sheet for Patients:  BloggerCourse.com  Fact Sheet for Healthcare Providers:  SeriousBroker.it  This test is no t yet approved or cleared by the [place] FDA and  has been authorized for detection and/or diagnosis of SARS-CoV-2 by FDA under an Emergency Use Authorization (EUA). This EUA will remain  in effect (meaning this test can be used) for the duration of the COVID-19 declaration under Section  564(b)(1) of the Act, 21 U.S.C.section 360bbb-3(b)(1), unless the authorization is terminated  or revoked sooner.       Influenza A by PCR NEGATIVE NEGATIVE Final   Influenza B by PCR NEGATIVE NEGATIVE Final    Comment: (NOTE) The Xpert Xpress SARS-CoV-2/FLU/RSV plus assay is intended as an aid in the diagnosis of influenza from Nasopharyngeal swab specimens and should not be used as a sole basis for treatment. Nasal washings and aspirates are unacceptable for Xpert Xpress SARS-CoV-2/FLU/RSV testing.  Fact Sheet for Patients: BloggerCourse.com  Fact Sheet for Healthcare Providers: SeriousBroker.it  This test is not yet approved or cleared by the [place] FDA and has been authorized for detection and/or diagnosis of SARS-CoV-2 by FDA under an Emergency Use Authorization (EUA). This EUA will remain in effect (meaning this test can be used) for the duration of the COVID-19 declaration under Section 564(b)(1) of the Act, 21 U.S.C. section 360bbb-3(b)(1), unless the authorization is terminated or revoked.  Performed at [facility], [address] [place], [place] [address]   Blood culture (routine x 2)     Status: None   Collection Time: 04/05/20  9:30 AM   Specimen: BLOOD  Result Value Ref Range Status   Specimen Description BLOOD  LEFT [facility]  Final   Special Requests   Final    BOTTLES DRAWN AEROBIC AND ANAEROBIC Blood Culture adequate volume   Culture   Final    NO GROWTH 5 DAYS Performed at [facility], [address] [place], [place] [address]    Report Status 04/10/2020 FINAL  Final  Blood culture (routine x 2)     Status: None   Collection Time: 04/05/20  9:40 AM   Specimen: BLOOD  Result Value Ref Range Status   Specimen Description BLOOD  LEFT HAND  Final   Special Requests   Final    BOTTLES DRAWN AEROBIC AND ANAEROBIC Blood Culture results may not be optimal due to an excessive volume of  blood received in culture bottles   Culture   Final    NO GROWTH 5 DAYS Performed at [facility], [address], [place], [place] [address]    Report Status 04/10/2020 FINAL  Final    RADIOLOGY:  No results found.   CODE STATUS:     Code Status Orders  (From admission, onward)         Start     Ordered   04/05/20 0958  Full code  Continuous        04/05/20 0958        Code Status History    Date Active Date Inactive Code Status Order ID Comments  User Context   03/20/2020 1502 03/24/2020 1942 Full Code [id number]  Lucile Shutters, MD ED   Advance Care Planning Activity       TOTAL TIME TAKING CARE OF THIS PATIENT: *35* minutes.    Enedina Finner M.D  [facility]    CC: Primary care physician; Patient, No Pcp Per

## 2020-05-27 ENCOUNTER — Telehealth: Payer: Self-pay | Admitting: Pharmacist

## 2020-05-27 NOTE — Telephone Encounter (Signed)
Patient failed to provide requested 2022 financial documentation. Unable to determine patient's eligibility status for [facility]. No additional medication assistance will be provided by [facility] without the required proof of income documentation. Patient notified by letter.  Vonda Henderson [facility] Administrative Assistant 

## 2022-01-16 IMAGING — MR MR ABDOMEN WO/W CM MRCP
19 of 21 series · 45 of 48 positions shown · IV contrast (8ml Gadavist)
Comparison: CT scan, same date.

CLINICAL DATA: Acute pancreatitis. History of recent
cholecystectomy.

EXAM:
MRI ABDOMEN WITH CONTRAST (WITH MRCP)
TECHNIQUE: Multiplanar multisequence MR imaging of the abdomen was performed
following the administration of intravenous contrast. Heavily
T2-weighted images of the biliary and pancreatic ducts were
obtained, and three-dimensional MRCP images were rendered by post
processing.
CONTRAST:  8mL GADAVIST GADOBUTROL 1 MMOL/ML IV SOLN

[Series 3: T2 · coronal · 6.0mm · 1.19mm/px · 2 of 34 slices shown (1 of 2)]
[im 1/34]
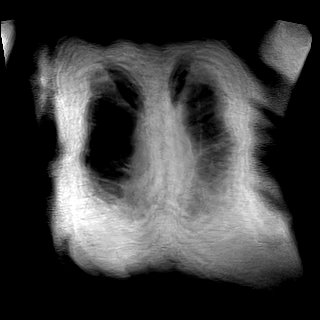
[im 34/34]
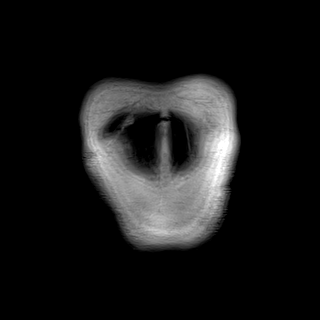

[Series 4: T2 · axial · 6.0mm · 1.19mm/px · 1 of 38 slices shown (2 of 2)]
[im 1/38]
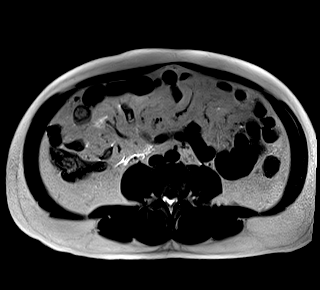

[Series 5: T1 · axial · 6.0mm · 0.74mm/px · z∈[-75,+191]mm · 3 of 76 slices shown]
[im 1/76]
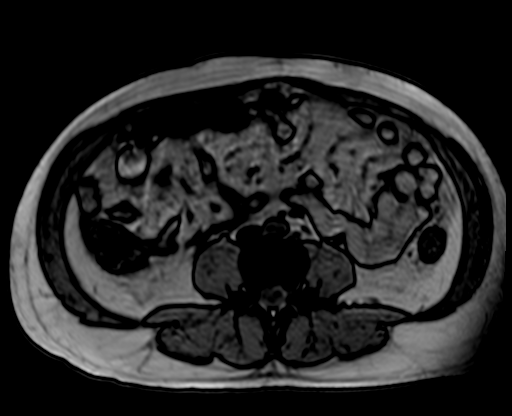
[im 38/76]
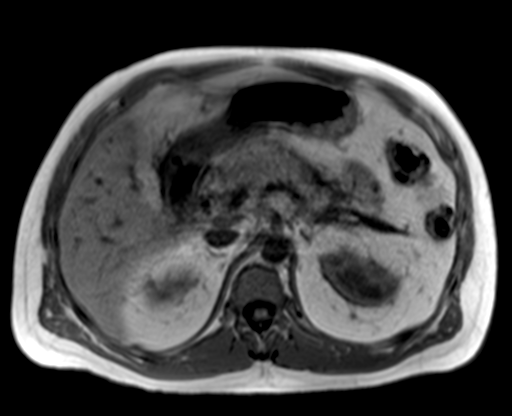
[im 76/76]
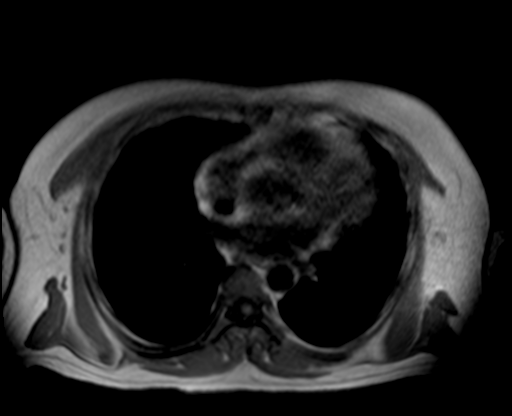

[Series 8: T2 fat-sat · axial · 6.0mm · 1.19mm/px · 1 of 36 slices shown]
[im 1/36]
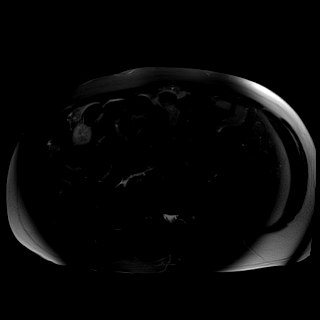

[Series 9: ax dwi_tracew · axial · 6.0mm · 1.42mm/px · z∈[-68,+184]mm · 4 of 108 slices shown]
[im 1/108]
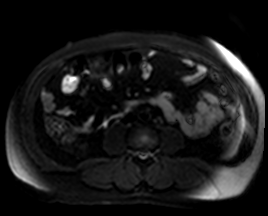
[im 36/108]
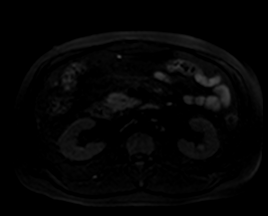
[im 72/108]
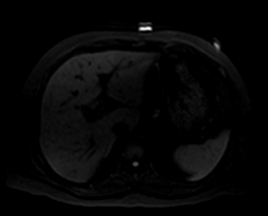
[im 108/108]
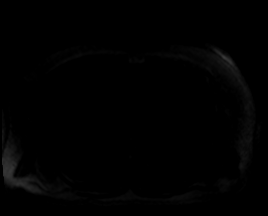

[Series 10: ax dwi_adc · axial · 6.0mm · 1.42mm/px · 1 of 36 slices shown]
[im 1/36]
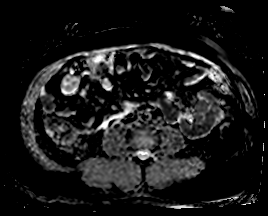

[Series 14: MRCP · coronal · 3.0mm · 1.12mm/px · 1 of 19 slices shown (1 of 2)]
[im 1/19]
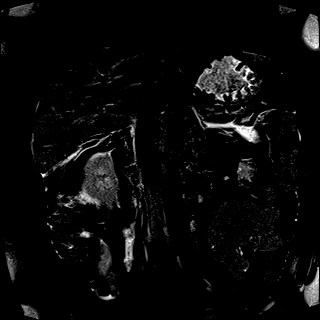

[Series 15: radials · coronal · 50.0mm · 0.78mm/px · 1 of 5 slices shown]
[im 1/5]
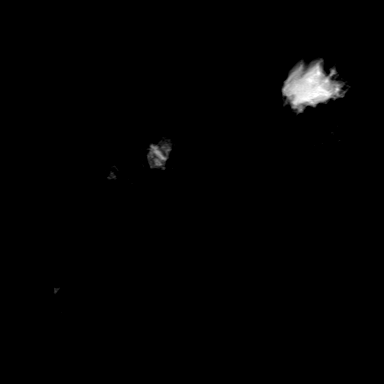

[Series 16: T1 dynamic fat-sat · axial · non-contrast · 3.0mm · 1.19mm/px · z∈[-73,+188]mm · 3 of 88 slices shown (1 of 5)]
[im 1/88]
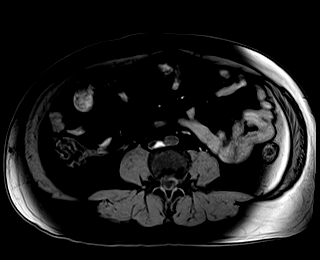
[im 44/88]
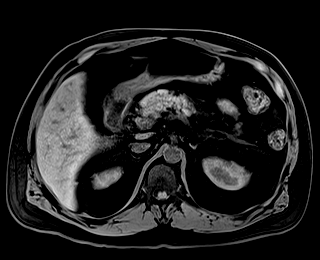
[im 88/88]
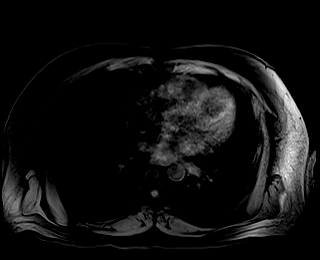

[Series 17: T1 dynamic fat-sat post-contrast · axial · 3.0mm · 1.19mm/px · z∈[-73,+188]mm · 3 of 88 slices shown (1 of 4)]
[im 1/88]
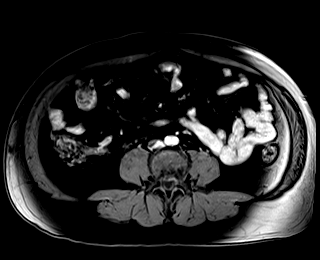
[im 44/88]
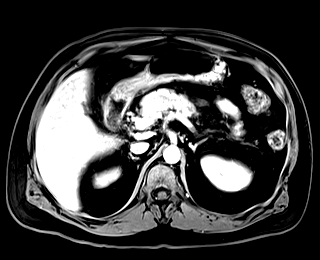
[im 88/88]
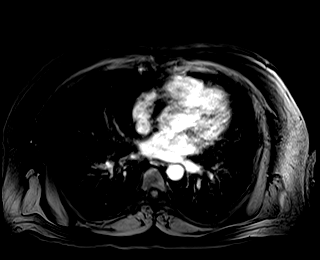

[Series 18: T1 dynamic fat-sat · axial · 3.0mm · 1.19mm/px · z∈[-73,+188]mm · 3 of 88 slices shown (2 of 5)]
[im 1/88]
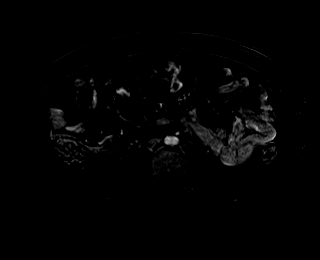
[im 44/88]
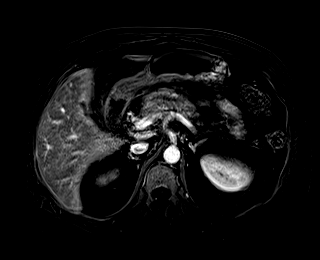
[im 88/88]
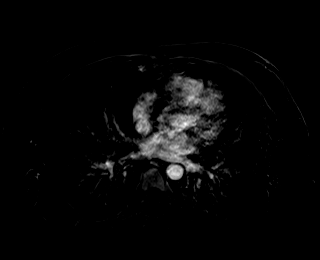

[Series 19: T1 dynamic fat-sat post-contrast · axial · 3.0mm · 1.19mm/px · z∈[-73,+188]mm · 3 of 88 slices shown (2 of 4)]
[im 1/88]
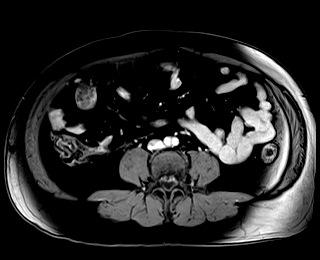
[im 44/88]
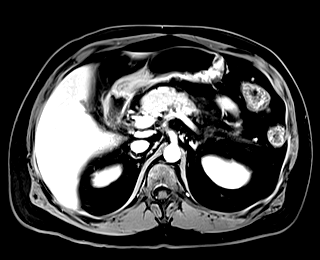
[im 88/88]
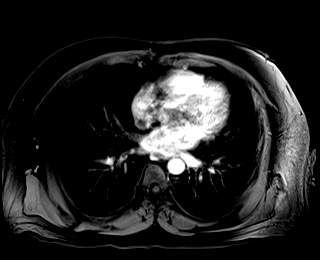

[Series 20: T1 dynamic fat-sat · axial · 3.0mm · 1.19mm/px · z∈[-73,+188]mm · 3 of 88 slices shown (3 of 5)]
[im 1/88]
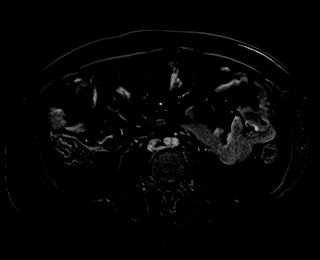
[im 44/88]
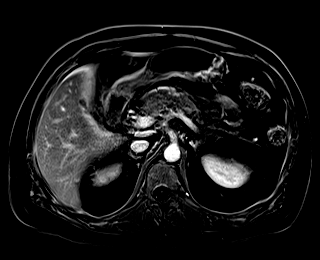
[im 88/88]
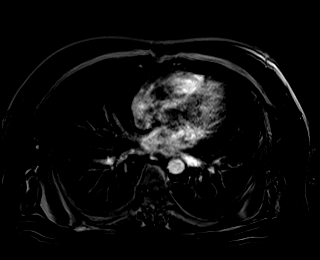

[Series 21: T1 dynamic fat-sat post-contrast · axial · 3.0mm · 1.19mm/px · z∈[-73,+188]mm · 3 of 88 slices shown (3 of 4)]
[im 1/88]
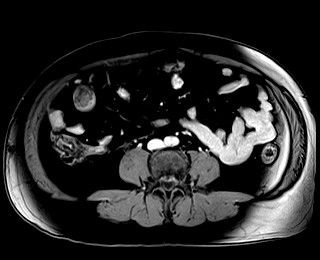
[im 44/88]
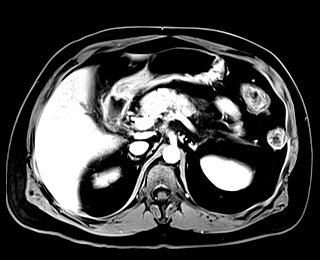
[im 88/88]
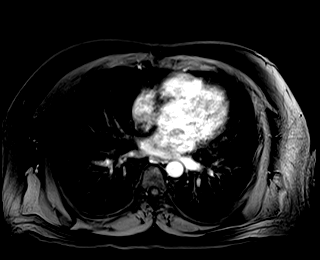

[Series 22: T1 dynamic fat-sat · axial · 3.0mm · 1.19mm/px · z∈[-73,+188]mm · 3 of 88 slices shown (4 of 5)]
[im 1/88]
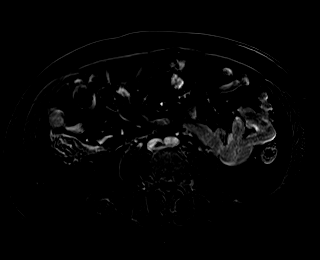
[im 44/88]
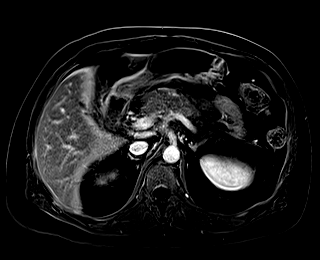
[im 88/88]
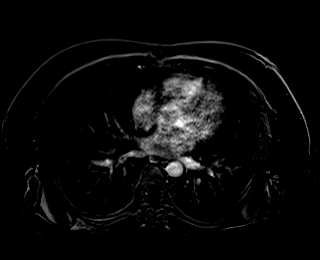

[Series 23: T1 dynamic post-contrast · coronal · 3.0mm · 1.31mm/px · 3 of 80 slices shown]
[im 1/80]
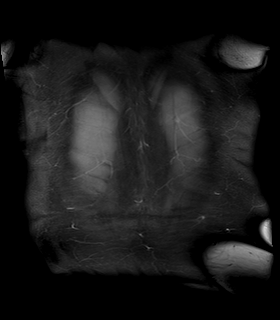
[im 40/80]
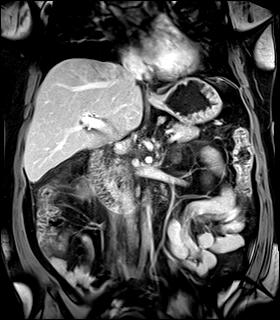
[im 80/80]
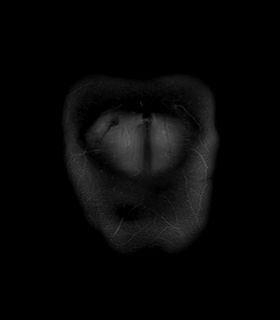

[Series 24: T1 dynamic fat-sat post-contrast · axial · 3.0mm · 1.19mm/px · z∈[-73,+188]mm · 3 of 88 slices shown (4 of 4)]
[im 1/88]
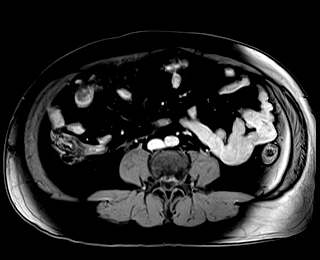
[im 44/88]
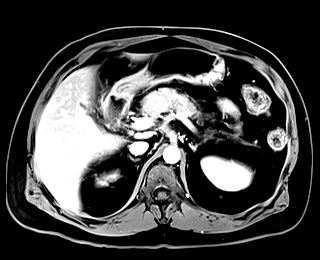
[im 88/88]
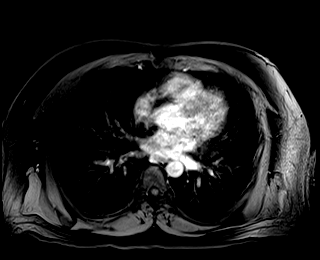

[Series 25: T1 dynamic fat-sat · axial · 3.0mm · 1.19mm/px · z∈[-73,+188]mm · 3 of 88 slices shown (5 of 5)]
[im 1/88]
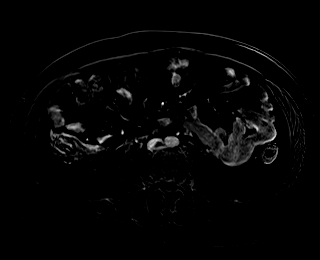
[im 44/88]
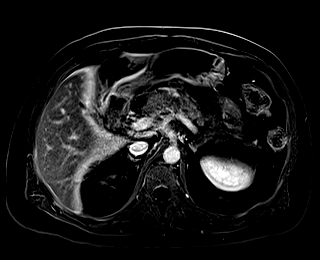
[im 88/88]
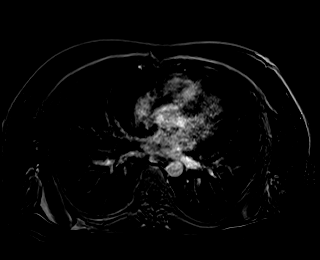

[Series 1039: MRCP · axial · 0.6mm · 0.57mm/px · 1 of 8 slices shown (2 of 2)]
[im 1/8]
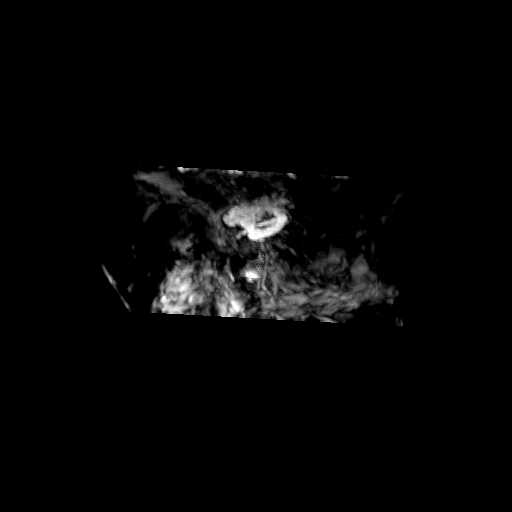

[45 of 48 positions shown; findings below may reference images not displayed]

FINDINGS: Lower chest: The lung bases are clear of an acute process. No
pulmonary lesions or pleural effusion. No pericardial effusion.

Hepatobiliary: No hepatic lesions or intrahepatic biliary
dilatation. The gallbladder is surgically absent. No complicating
features such as bile leak or abscess. Small not unexpected
postoperative fluid collection in the gallbladder fossa. Normal
caliber and course of the common bile duct. There is mild diffuse
inflammation of common bile duct likely due to the surrounding
pancreatitis. Cholangitis would be another possibility. No evidence
of enhancement of intrahepatic ducts. No findings for common bile
duct stones.

Pancreas: MR findings consistent with acute interstitial
pancreatitis. Pancreas is mildly enlarged and diffusely inflamed
with peripancreatic inflammation and fluid. No findings for
pancreatic necrosis. Normal main pancreatic duct.

Spleen:  Normal size.  No focal lesions.

Adrenals/Urinary Tract: The adrenal glands kidneys are unremarkable.

Stomach/Bowel: There appears to be mild diffuse inflammation of the
colon possibly inflammatory or infectious colitis. There is also
moderate inflammation of the duodenum due to the surrounding
pancreatitis.

Vascular/Lymphatic: The aorta and branch vessels are normal. The
major venous structures are patent. There are small scattered upper
abdominal lymph nodes which are likely reactive.

Other: No focal abdominal fluid collections. No abdominal wall
hernia.

Musculoskeletal: No significant bony findings.
IMPRESSION: 1. MR findings consistent with acute interstitial pancreatitis. No
findings for pancreatic necrosis.
2. Mild diffuse inflammation and enhancement of the common bile duct
likely due to the surrounding pancreatitis. Cholangitis would be
another possibility. No obstructing common bile duct stones are
identified.
3. Status post cholecystectomy. No complicating features such as
bile leak or abscess. Small not unexpected postop fluid collection
in the gallbladder fossa.
4. Mild diffuse inflammation of the colon possibly inflammatory or
infectious colitis.
